# Patient Record
Sex: Male | Born: 1955 | Race: White | Hispanic: Yes | Marital: Married | Smoking: Never smoker
Health system: Southern US, Community
[De-identification: ages and names within clinical notes are randomized; demographics above are authoritative.]

## PROBLEM LIST (undated history)

## (undated) DIAGNOSIS — G43909 Migraine, unspecified, not intractable, without status migrainosus: Secondary | ICD-10-CM

## (undated) DIAGNOSIS — R42 Dizziness and giddiness: Secondary | ICD-10-CM

## (undated) HISTORY — PX: APPENDECTOMY: SHX54

## (undated) HISTORY — PX: EYE SURGERY: SHX253

---

## 1998-05-08 ENCOUNTER — Emergency Department (HOSPITAL_COMMUNITY): Admission: EM | Admit: 1998-05-08 | Discharge: 1998-05-08 | Payer: Self-pay | Admitting: Emergency Medicine

## 2009-08-14 ENCOUNTER — Ambulatory Visit: Payer: Self-pay | Admitting: Unknown Physician Specialty

## 2012-04-11 ENCOUNTER — Emergency Department (HOSPITAL_COMMUNITY)
Admission: EM | Admit: 2012-04-11 | Discharge: 2012-04-11 | Disposition: A | Discharge status: Home / Self Care | Attending: Emergency Medicine | Admitting: Emergency Medicine

## 2012-04-11 ENCOUNTER — Encounter (HOSPITAL_COMMUNITY): Payer: Self-pay | Admitting: Emergency Medicine

## 2012-04-11 DIAGNOSIS — G43909 Migraine, unspecified, not intractable, without status migrainosus: Secondary | ICD-10-CM | POA: Insufficient documentation

## 2012-04-11 HISTORY — DX: Migraine, unspecified, not intractable, without status migrainosus: G43.909

## 2012-04-11 MED ORDER — SUMATRIPTAN SUCCINATE 6 MG/0.5ML ~~LOC~~ SOLN
6.0000 mg | Freq: Once | SUBCUTANEOUS | Status: AC
  Administered 2012-04-11: 6 mg via SUBCUTANEOUS
  Filled 2012-04-11: qty 0.5

## 2012-04-11 MED ORDER — DIPHENHYDRAMINE HCL 50 MG/ML IJ SOLN
12.5000 mg | Freq: Once | INTRAMUSCULAR | Status: AC
  Administered 2012-04-11: 12.5 mg via INTRAVENOUS
  Filled 2012-04-11: qty 1

## 2012-04-11 MED ORDER — METOCLOPRAMIDE HCL 5 MG/ML IJ SOLN
10.0000 mg | Freq: Once | INTRAMUSCULAR | Status: AC
  Administered 2012-04-11: 10 mg via INTRAVENOUS
  Filled 2012-04-11: qty 2

## 2012-04-11 MED ORDER — KETOROLAC TROMETHAMINE 30 MG/ML IJ SOLN
30.0000 mg | Freq: Once | INTRAMUSCULAR | Status: AC
  Administered 2012-04-11: 30 mg via INTRAVENOUS
  Filled 2012-04-11: qty 1

## 2012-04-11 MED ORDER — SODIUM CHLORIDE 0.9 % IV BOLUS (SEPSIS)
1000.0000 mL | Freq: Once | INTRAVENOUS | Status: AC
  Administered 2012-04-11: 1000 mL via INTRAVENOUS

## 2012-04-11 NOTE — ED Notes (Signed)
Pt states he has a lot of stress at work and it has triggered a migraine headache  Pt states the pain is on the right side of his head and is feeling dizzy  Pt states pain started yesterday  Pt states has nausea without vomiting  Pt states he has taken medication at home without relief

## 2012-04-11 NOTE — ED Provider Notes (Signed)
History     CSN: [id number]  Arrival date & time 04/11/12  0459   First MD Initiated Contact with Patient 04/11/12 0600      Chief Complaint  Patient presents with  . Migraine    (Consider location/radiation/quality/duration/timing/severity/associated sxs/prior treatment) HPI  Patient presents to emergency department complaining of gradual onset migraine that began yesterday and has persisted throughout the night and into today. Patient states he has a history of chronic migraines that are intermittent however states that over the last few weeks he's had increased frequency in his migraines do to increased stress at work. Patient states that he has a primary care physician in [place] to manage his his migraines with Imitrex. Patient states he had gradual onset headache that began yesterday but did not take his Imitrex. Patient states that when the headache persisted throughout the night and into this morning he took Imitrex but states "I think I waited too long." Patient states that headache is consistent with his past history of migraines with pain on the right side of his head and is associated with nausea, photophobia, and feeling lightheaded. He denies fevers, chills, neck stiffness, difficulty speaking or ambulating. Loss of coordination, rash, chest pain, abdominal pain, extremity numbness/tingling/weakness. Patient states symptoms are gradual onset, persistent, and unchanging. Symptoms are aggravated by light in denies alleviating factors.  Past Medical History  Diagnosis Date  . Migraine     Past Surgical History  Procedure Date  . Appendectomy   . Eye surgery     Family History  Problem Relation Age of Onset  . Migraines Mother   . Hypertension Mother   . Migraines Father   . Diabetes Father     History  Substance Use Topics  . Smoking status: Never Smoker   . Smokeless tobacco: Not on file  . Alcohol Use: No      Review of Systems  All other systems  reviewed and are negative.    Allergies  Review of patient's allergies indicates no known allergies.  Home Medications   Current Outpatient Rx  Name Route Sig Dispense Refill  . VITAMIN B-6 50 MG PO TABS Oral Take 50 mg by mouth daily.    . SUMATRIPTAN SUCCINATE 50 MG PO TABS Oral Take 50 mg by mouth every 2 (two) hours as needed.     Marland Kitchen VITAMIN C 500 MG PO TABS Oral Take 1,000 mg by mouth daily.      BP 149/99  Pulse 69  Temp 98.5 F (36.9 C) (Oral)  Resp 20  SpO2 100%  Physical Exam  Nursing note and vitals reviewed. Constitutional: He is oriented to person, place, and time. He appears well-developed and well-nourished. No distress.  HENT:  Head: Normocephalic and atraumatic.  Eyes: Conjunctivae and EOM are normal. Pupils are equal, round, and reactive to light.       No nystagmus  Neck: Normal range of motion. Neck supple.       No meningeal signs  Cardiovascular: Normal rate, regular rhythm, normal heart sounds and intact distal pulses.  Exam reveals no gallop and no friction rub.   No murmur heard. Pulmonary/Chest: Effort normal and breath sounds normal. No respiratory distress. He has no wheezes. He has no rales. He exhibits no tenderness.  Abdominal: Soft. Bowel sounds are normal. He exhibits no distension and no mass. There is no tenderness. There is no rebound and no guarding.  Musculoskeletal: Normal range of motion. He exhibits no edema and no tenderness.  Neurological:  He is alert and oriented to person, place, and time. No cranial nerve deficit. Coordination normal.  Skin: Skin is warm and dry. No rash noted. He is not diaphoretic. No erythema.  Psychiatric: He has a normal mood and affect.    ED Course  Procedures (including critical care time)  IV fluids, IV reglan, toradol and benadryl.   Patient's migraine decreased from 7-8 to 1-2 after aforementioned medications.   IM imitrex  Labs Reviewed - No data to display No results found.   1. Migraine         MDM  Patient's migraine has decreased from an 8/10 pain to a 1-2/10 pain after IV migraine medications. Patient describes this current headache as his regular recurrent migraines. He has no neuro focal findings. No meningeal signs. He's afebrile and nontoxic-appearing. No concern for intracranial abnormality associated with this current headache. Patient has a regular medical physician who anxious his migraines he is agreeable to close followup for further discussion of his increasing frequency of migraines.        [place], [place] 04/11/12 [phone]

## 2012-04-12 NOTE — ED Provider Notes (Signed)
Medical screening examination/treatment/procedure(s) were performed by non-physician practitioner and as supervising physician I was immediately available for consultation/collaboration.   Hanley Seamen, MD 04/12/12 [phone]

## 2015-09-09 ENCOUNTER — Emergency Department (HOSPITAL_COMMUNITY)
Admission: EM | Admit: 2015-09-09 | Discharge: 2015-09-09 | Disposition: A | Discharge status: Home / Self Care | Attending: Emergency Medicine | Admitting: Emergency Medicine

## 2015-09-09 ENCOUNTER — Encounter (HOSPITAL_COMMUNITY): Payer: Self-pay

## 2015-09-09 ENCOUNTER — Emergency Department (HOSPITAL_COMMUNITY)

## 2015-09-09 DIAGNOSIS — J069 Acute upper respiratory infection, unspecified: Secondary | ICD-10-CM | POA: Diagnosis not present

## 2015-09-09 DIAGNOSIS — Z79899 Other long term (current) drug therapy: Secondary | ICD-10-CM | POA: Diagnosis not present

## 2015-09-09 DIAGNOSIS — R05 Cough: Secondary | ICD-10-CM | POA: Diagnosis present

## 2015-09-09 DIAGNOSIS — J208 Acute bronchitis due to other specified organisms: Secondary | ICD-10-CM

## 2015-09-09 DIAGNOSIS — G43909 Migraine, unspecified, not intractable, without status migrainosus: Secondary | ICD-10-CM | POA: Diagnosis not present

## 2015-09-09 MED ORDER — BENZONATATE 100 MG PO CAPS: 100.0000 mg | ORAL_CAPSULE | Freq: Three times a day (TID) | ORAL | Status: DC

## 2015-09-09 MED ORDER — BENZONATATE 100 MG PO CAPS
100.0000 mg | ORAL_CAPSULE | Freq: Once | ORAL | Status: AC
  Administered 2015-09-09: 100 mg via ORAL
  Filled 2015-09-09: qty 1

## 2015-09-09 NOTE — ED Notes (Signed)
Pt given rx and d/c instructions, verbalized understanding.

## 2015-09-09 NOTE — Discharge Instructions (Signed)
You have been seen today for a cough. Your imaging showed no abnormalities. Follow up with PCP in one week if you're not feeling better. Return to ED should symptoms worsen. Get plenty of rest and drink plenty of fluids. You may use tylenol or ibuprofen for pain or fever.

## 2015-09-09 NOTE — ED Provider Notes (Signed)
CSN: [id number]     Arrival date & time 09/09/15  0441 History   First MD Initiated Contact with Patient 09/09/15 0600     Chief Complaint  Patient presents with  . Cough     (Consider location/radiation/quality/duration/timing/severity/associated sxs/prior Treatment) HPI   Brian Williamson is a 59 y.o. male, patient with no pertinent past medical history, presenting to the ED with productive cough with green-yellow sputum that began three days ago. Pt denies fever, N/V/C/D, abdominal pain, chest pain, shortness of breath, or any other complaints. Pt has tried robitussin with minimal relief.     Past Medical History  Diagnosis Date  . Migraine    Past Surgical History  Procedure Laterality Date  . Appendectomy    . Eye surgery     Family History  Problem Relation Age of Onset  . Migraines Mother   . Hypertension Mother   . Migraines Father   . Diabetes Father    Social History  Substance Use Topics  . Smoking status: Never Smoker   . Smokeless tobacco: None  . Alcohol Use: No    Review of Systems  Constitutional: Negative for fever and diaphoresis.  Respiratory: Positive for cough. Negative for chest tightness, shortness of breath, wheezing and stridor.   Cardiovascular: Negative for chest pain.  Gastrointestinal: Negative for nausea, vomiting, abdominal pain, diarrhea and constipation.  Skin: Negative for pallor.  Neurological: Negative for dizziness, light-headedness and headaches.      Allergies  Review of patient's allergies indicates no known allergies.  Home Medications   Prior to Admission medications   Medication Sig Start Date End Date Taking? Authorizing Provider  b complex vitamins tablet Take 1 tablet by mouth daily.   Yes Historical Provider, MD  guaifenesin (ROBITUSSIN) 100 MG/5ML syrup Take 400 mg by mouth 3 (three) times daily as needed for cough.   Yes Historical Provider, MD  SUMAtriptan (IMITREX) 50 MG tablet Take 50 mg by mouth  every 2 (two) hours as needed.    Yes Historical Provider, MD  vitamin C (ASCORBIC ACID) 500 MG tablet Take 1,000 mg by mouth daily.   Yes Historical Provider, MD  benzonatate (TESSALON) 100 MG capsule Take 1 capsule (100 mg total) by mouth every 8 (eight) hours. 09/09/15   Sabrena Gavitt C Shallon Yaklin, PA-C   BP 132/76 mmHg  Pulse 68  Temp(Src) 98.6 F (37 C) (Oral)  Resp 16  Ht 5\' 9"  (1.753 m)  Wt 86.183 kg  BMI 28.05 kg/m2  SpO2 98% Physical Exam  Constitutional: He appears well-developed and well-nourished. No distress.  HENT:  Head: Normocephalic and atraumatic.  Right Ear: External ear normal.  Left Ear: External ear normal.  Nose: Nose normal.  Mouth/Throat: Oropharynx is clear and moist.  Eyes: Conjunctivae are normal. Pupils are equal, round, and reactive to light.  Neck: Normal range of motion. Neck supple.  Cardiovascular: Normal rate, regular rhythm and normal heart sounds.   Pulmonary/Chest: Effort normal and breath sounds normal. No tachypnea. No respiratory distress.  Abdominal: Soft. Bowel sounds are normal.  Musculoskeletal: He exhibits no edema or tenderness.  Lymphadenopathy:    He has no cervical adenopathy.  Neurological: He is alert.  Skin: Skin is warm and dry. He is not diaphoretic.  Nursing note and vitals reviewed.   ED Course  Procedures (including critical care time) Labs Review Labs Reviewed - No data to display  Imaging Review Dg Chest 2 View  09/09/2015  CLINICAL DATA:  Acute onset of sore throat, cough,  congestion and diffuse achiness. Initial encounter. EXAM: CHEST  2 VIEW COMPARISON:  None. FINDINGS: The lungs are well-aerated and clear. There is no evidence of focal opacification, pleural effusion or pneumothorax. The heart is normal in size; the mediastinal contour is within normal limits. No acute osseous abnormalities are seen. IMPRESSION: No acute cardiopulmonary process seen. Electronically Signed   By: Roanna Raider M.D.   On: 09/09/2015 05:27   I  have personally reviewed and evaluated these images and lab results as part of my medical decision-making.   EKG Interpretation None      MDM   Final diagnoses:  Acute URI  Acute viral bronchitis    Brian Williamson presents with a productive cough for 4 days.  Suspect pt has viral bronchitis with viral URI. Patient is afebrile, nontoxic appearing, and in no distress. Symptomatic treatment is indicated. Patient will be instructed to increase fluid intake, get plenty or rest, use Tessalon Perles for cough, ibuprofen or Tylenol for pain or if he develops a fever.  Filed Vitals:   09/09/15 0446 09/09/15 0659  BP: 150/90 132/76  Pulse: 76 68  Temp: 98.6 F (37 C)   TempSrc: Oral   Resp: 18 16  Height:  (1.753 m)   Weight: 86.183 kg   SpO2: 98% 98%     Concepcion Living 09/09/15 0741  April Palumbo, MD 09/09/15 [phone]

## 2015-09-09 NOTE — ED Notes (Signed)
Pt c/o cough and congestion since Sat; pt states that the cough has gotten progressively worse and yesterday he states that he began coughing up yellowish green sputum; pt c/o rib cage being sore from coughing

## 2015-09-09 NOTE — ED Notes (Signed)
Pt complains of a dry cough for one week, this morning he states he was coughing something green up, he states he hurts in his ribs and he's unable to sleep

## 2018-10-30 ENCOUNTER — Encounter (HOSPITAL_COMMUNITY): Payer: Self-pay | Admitting: *Deleted

## 2018-10-30 ENCOUNTER — Other Ambulatory Visit: Payer: Self-pay

## 2018-10-30 ENCOUNTER — Observation Stay (HOSPITAL_COMMUNITY)
Admission: EM | Admit: 2018-10-30 | Discharge: 2018-11-01 | Disposition: A | Discharge status: Home / Self Care | Attending: Internal Medicine | Admitting: Internal Medicine

## 2018-10-30 ENCOUNTER — Emergency Department (HOSPITAL_COMMUNITY)

## 2018-10-30 DIAGNOSIS — G43909 Migraine, unspecified, not intractable, without status migrainosus: Secondary | ICD-10-CM | POA: Diagnosis not present

## 2018-10-30 DIAGNOSIS — H81399 Other peripheral vertigo, unspecified ear: Secondary | ICD-10-CM | POA: Diagnosis present

## 2018-10-30 DIAGNOSIS — Z79899 Other long term (current) drug therapy: Secondary | ICD-10-CM | POA: Diagnosis not present

## 2018-10-30 DIAGNOSIS — R42 Dizziness and giddiness: Secondary | ICD-10-CM

## 2018-10-30 DIAGNOSIS — R9431 Abnormal electrocardiogram [ECG] [EKG]: Secondary | ICD-10-CM | POA: Insufficient documentation

## 2018-10-30 DIAGNOSIS — Z8249 Family history of ischemic heart disease and other diseases of the circulatory system: Secondary | ICD-10-CM | POA: Insufficient documentation

## 2018-10-30 HISTORY — DX: Dizziness and giddiness: R42

## 2018-10-30 LAB — COMPREHENSIVE METABOLIC PANEL
ALK PHOS: 50 U/L (ref 38–126)
ALT: 32 U/L (ref 0–44)
AST: 26 U/L (ref 15–41)
Albumin: 4 g/dL (ref 3.5–5.0)
Anion gap: 11 (ref 5–15)
BILIRUBIN TOTAL: 0.5 mg/dL (ref 0.3–1.2)
BUN: 24 mg/dL — AB (ref 8–23)
CALCIUM: 9.2 mg/dL (ref 8.9–10.3)
CO2: 24 mmol/L (ref 22–32)
CREATININE: 1.18 mg/dL (ref 0.61–1.24)
Chloride: 103 mmol/L (ref 98–111)
Glucose, Bld: 170 mg/dL — ABNORMAL HIGH (ref 70–99)
Potassium: 3.5 mmol/L (ref 3.5–5.1)
Sodium: 138 mmol/L (ref 135–145)
TOTAL PROTEIN: 6.9 g/dL (ref 6.5–8.1)

## 2018-10-30 LAB — CBC WITH DIFFERENTIAL/PLATELET
Abs Immature Granulocytes: 0.09 10*3/uL — ABNORMAL HIGH (ref 0.00–0.07)
BASOS PCT: 0 %
Basophils Absolute: 0 10*3/uL (ref 0.0–0.1)
Eosinophils Absolute: 0.3 10*3/uL (ref 0.0–0.5)
Eosinophils Relative: 2 %
HEMATOCRIT: 41.3 % (ref 39.0–52.0)
Hemoglobin: 13.7 g/dL (ref 13.0–17.0)
Immature Granulocytes: 1 %
LYMPHS ABS: 1.3 10*3/uL (ref 0.7–4.0)
Lymphocytes Relative: 12 %
MCH: 28.7 pg (ref 26.0–34.0)
MCHC: 33.2 g/dL (ref 30.0–36.0)
MCV: 86.4 fL (ref 80.0–100.0)
MONO ABS: 0.7 10*3/uL (ref 0.1–1.0)
MONOS PCT: 7 %
NEUTROS ABS: 8.2 10*3/uL — AB (ref 1.7–7.7)
Neutrophils Relative %: 78 %
PLATELETS: 160 10*3/uL (ref 150–400)
RBC: 4.78 MIL/uL (ref 4.22–5.81)
RDW: 13.5 % (ref 11.5–15.5)
WBC: 10.6 10*3/uL — ABNORMAL HIGH (ref 4.0–10.5)
nRBC: 0 % (ref 0.0–0.2)

## 2018-10-30 LAB — MAGNESIUM: Magnesium: 1.8 mg/dL (ref 1.7–2.4)

## 2018-10-30 MED ORDER — ONDANSETRON HCL 4 MG/2ML IJ SOLN
4.0000 mg | Freq: Once | INTRAMUSCULAR | Status: AC
  Administered 2018-10-30: 4 mg via INTRAVENOUS
  Filled 2018-10-30: qty 2

## 2018-10-30 MED ORDER — LORAZEPAM 2 MG/ML IJ SOLN
0.5000 mg | Freq: Once | INTRAMUSCULAR | Status: AC
  Administered 2018-10-30: 0.5 mg via INTRAVENOUS
  Filled 2018-10-30: qty 1

## 2018-10-30 MED ORDER — MECLIZINE HCL 25 MG PO TABS
25.0000 mg | ORAL_TABLET | Freq: Once | ORAL | Status: AC
Start: 2018-10-30 — End: 2018-10-30
  Administered 2018-10-30: 25 mg via ORAL
  Filled 2018-10-30: qty 1

## 2018-10-30 NOTE — ED Triage Notes (Addendum)
PT reports a sudden onb set of vomiting while he was his son tonight. PT also reported he started to feel dizzy around 1500 to day. Pt reported while he was helping his son with a bath he was unable to stand because he was dizzy.

## 2018-10-30 NOTE — ED Notes (Signed)
Patient transported to MRI 

## 2018-10-30 NOTE — ED Notes (Signed)
This RN attempted to ambulate the pt in his room. The pt sat on the side of the bed and continued to feel dizzy. He sat on the side of the bed for one minute. The pt sat further on the bed and continued to feel dizzy. He started dry heaving and them threw up yellow liquid. Pt unable to stand and got back in the bed. MD notified.

## 2018-10-30 NOTE — H&P (Signed)
History and Physical    Brian Williamson ZOX:[id number] DOB: July 28, 1956 DOA: 10/30/2018  Referring MD/NP/PA:   PCP: Jerl Mina, MD   Patient coming from:  The patient is coming from home.  At baseline, pt is independent for most of ADL.        Chief Complaint: Dizziness, nausea, vomiting and poor balance  HPI: Brian Williamson is a 63 y.o. male with medical history significant of migraine headache, who presents with dizziness, nausea vomiting, and poor balance.  Patient states that his symptoms started at about 4 PM, including dizziness, nausea, vomiting and poor balance.  She has vomited more than 5 times with nonbilious and nonbloody vomitus.  No diarrhea or abdominal pain.  Patient does not have ear ringing or hearing loss.  No unilateral weakness, numbness or tingling his extremities.  No vision change or hearing loss.  Patient states that his dizziness is worse with head position change.  Patient does not have chest pain, shortness of breath, cough, fever or chills.  No symptoms of UTI.  ED Course: pt was found to have WBC 10.6, creatinine 1.18, BUN 24, GFR> 60, no tachycardia, no tachypnea, oxygen saturation 89 to 95% on room air.  MRI of the brain is negative for acute intracranial abnormalities.  Patient is placed on MedSurg bed for observation.  Review of Systems:   General: no fevers, chills, no body weight gain, has poor appetite, has fatigue HEENT: no blurry vision, hearing changes or sore throat Respiratory: no dyspnea, coughing, wheezing CV: no chest pain, no palpitations GI: has nausea, vomiting, no abdominal pain, diarrhea, constipation GU: no dysuria, burning on urination, increased urinary frequency, hematuria  Ext: no leg edema Neuro: no unilateral weakness, numbness, or tingling, no vision change or hearing loss. Has poor balance and dizziness. Skin: no rash, no skin tear. MSK: No muscle spasm, no deformity, no limitation of range of movement in  spin Heme: No easy bruising.  Travel history: No recent long distant travel.  Allergy: No Known Allergies  Past Medical History:  Diagnosis Date  . Migraine     Past Surgical History:  Procedure Laterality Date  . APPENDECTOMY    . EYE SURGERY      Social History:  reports that he has never smoked. He has never used smokeless tobacco. He reports that he does not drink alcohol or use drugs.  Family History:  Family History  Problem Relation Age of Onset  . Migraines Mother   . Hypertension Mother   . Migraines Father   . Diabetes Father      Prior to Admission medications   Medication Sig Start Date End Date Taking? Authorizing Provider  b complex vitamins tablet Take 1 tablet by mouth daily.   Yes [provider]  COD LIVER OIL PO Take 1 tablet by mouth daily.   Yes [provider]  guaifenesin (ROBITUSSIN) 100 MG/5ML syrup Take 400 mg by mouth 3 (three) times daily as needed for cough.   Yes [provider]  ibuprofen (ADVIL,MOTRIN) 800 MG tablet Take 800 mg by mouth every 8 (eight) hours as needed for moderate pain.   Yes [provider]  SUMAtriptan (IMITREX) 50 MG tablet Take 50 mg by mouth every 2 (two) hours as needed for migraine.    Yes [provider]  vitamin C (ASCORBIC ACID) 500 MG tablet Take 1,000 mg by mouth daily.   Yes [provider]  benzonatate (TESSALON) 100 MG capsule Take 1 capsule (100 mg total)  by mouth every 8 (eight) hours. Patient not taking: Reported on 10/30/2018 09/09/15   Concepcion LivingJoy, Shawn C, PA-C    Physical Exam: Vitals:   10/31/18 0100 10/31/18 0115 10/31/18 0130 10/31/18 0149  BP: (!) 143/85 (!) 158/88 134/76 (!) 158/87  Pulse: 92 100 81 85  Resp:  16    Temp:  98.7 F (37.1 C)  97.9 F (36.6 C)  TempSrc:  Oral  Oral  SpO2: 93% 94% 91% 98%  Weight:    81.2 kg  Height:    5\' 10"  (1.778 m)   General: Not in acute distress HEENT:       Eyes: PERRL, EOMI, no scleral icterus.        ENT: No discharge from the ears and nose, no pharynx injection, no tonsillar enlargement.        Neck: No JVD, no bruit, no mass felt. Heme: No neck lymph node enlargement. Cardiac: S1/S2, RRR, No murmurs, No gallops or rubs. Respiratory: No rales, wheezing, rhonchi or rubs. GI: Soft, nondistended, nontender, no rebound pain, no organomegaly, BS present. GU: No hematuria Ext: No pitting leg edema bilaterally. 2+DP/PT pulse bilaterally. Musculoskeletal: No joint deformities, No joint redness or warmth, no limitation of ROM in spin. Skin: No rashes.  Neuro: Alert, oriented X3, cranial nerves II-XII grossly intact, moves all extremities normally. Psych: Patient is not psychotic, no suicidal or hemocidal ideation.  Labs on Admission: I have personally reviewed following labs and imaging studies  CBC: Recent Labs  Lab 10/30/18 1710  WBC 10.6*  NEUTROABS 8.2*  HGB 13.7  HCT 41.3  MCV 86.4  PLT 160   Basic Metabolic Panel: Recent Labs  Lab 10/30/18 1710  NA 138  K 3.5  CL 103  CO2 24  GLUCOSE 170*  BUN 24*  CREATININE 1.18  CALCIUM 9.2  MG 1.8   GFR: Estimated Creatinine Clearance: 67 mL/min (by C-G formula based on SCr of 1.18 mg/dL). Liver Function Tests: Recent Labs  Lab 10/30/18 1710  AST 26  ALT 32  ALKPHOS 50  BILITOT 0.5  PROT 6.9  ALBUMIN 4.0   No results for input(s): LIPASE, AMYLASE in the last 168 hours. No results for input(s): AMMONIA in the last 168 hours. Coagulation Profile: No results for input(s): INR, PROTIME in the last 168 hours. Cardiac Enzymes: No results for input(s): CKTOTAL, CKMB, CKMBINDEX, TROPONINI in the last 168 hours. BNP (last 3 results) No results for input(s): PROBNP in the last 8760 hours. HbA1C: No results for input(s): HGBA1C in the last 72 hours. CBG: No results for input(s): GLUCAP in the last 168 hours. Lipid Profile: No results for input(s): CHOL, HDL, LDLCALC, TRIG, CHOLHDL, LDLDIRECT in the last 72 hours. Thyroid  Function Tests: No results for input(s): TSH, T4TOTAL, FREET4, T3FREE, THYROIDAB in the last 72 hours. Anemia Panel: No results for input(s): VITAMINB12, FOLATE, FERRITIN, TIBC, IRON, RETICCTPCT in the last 72 hours. Urine analysis: No results found for: COLORURINE, APPEARANCEUR, LABSPEC, PHURINE, GLUCOSEU, HGBUR, BILIRUBINUR, KETONESUR, PROTEINUR, UROBILINOGEN, NITRITE, LEUKOCYTESUR Sepsis Labs: @LABRCNTIP (procalcitonin:4,lacticidven:4) )No results found for this or any previous visit (from the past 240 hour(s)).   Radiological Exams on Admission: Mr Brain Wo Contrast  Result Date: 10/30/2018 CLINICAL DATA:  63 y/o  M; sudden onset vomiting and dizziness. EXAM: MRI HEAD WITHOUT CONTRAST TECHNIQUE: Multiplanar, multiecho pulse sequences of the brain and surrounding structures were obtained without intravenous contrast. COMPARISON:  None. FINDINGS: Brain: No acute infarction, hemorrhage, hydrocephalus, extra-axial collection or mass lesion. Punctate nonspecific T2 FLAIR  hyperintensities in subcortical and periventricular white matter are compatible with mild chronic microvascular ischemic changes. Mild volume loss of the brain. Vascular: Normal flow voids. Skull and upper cervical spine: Normal marrow signal. Sinuses/Orbits: Mild ethmoid and left maxillary sinus mucosal thickening. No abnormal signal of mastoid air cells. Orbits are unremarkable. Other: None. IMPRESSION: 1. No acute intracranial abnormality identified. 2. Mild chronic microvascular ischemic changes and volume loss of the brain. Electronically Signed   By: Mitzi HansenLance  Furusawa-Stratton M.D.   On: 10/30/2018 20:32     EKG: Independently reviewed.  Sinus rhythm, QTC 476, LAD, early R wave progression, nonspecific T wave change.  Assessment/Plan Principal Problem:   Vertigo Active Problems:   Migraine   Vertigo: MRI of the brain is negative for acute intracranial abnormalities.Patient seems to have peripheral vertigo. -Placed on  MedSurg bed for observation -PRN meclizine -IV fluid: 1 L normal saline, then 125 cc/h - OT/vestibular PT  Migraine: -As needed sumatriptan    DVT ppx: SQ Lovenox Code Status: Full code Family Communication: Yes, patient's wife, son and grandson   at bed side Disposition Plan:  Anticipate discharge back to previous home environment Consults called:  none Admission status:    medical floor/obs      Date of Service 10/31/2018    Lorretta HarpXilin Kedar Sedano [facility]   If 7PM-7AM, please contact night-coverage [email or web address] Password [email or web address] 10/31/2018, 4:46 AM

## 2018-10-30 NOTE — ED Notes (Signed)
Admitting at bedside 

## 2018-10-30 NOTE — ED Provider Notes (Signed)
[facility] EMERGENCY DEPARTMENT Provider Note   CSN: [id number] Arrival date & time: 10/30/18  1624     History   Chief Complaint Chief Complaint  Patient presents with  . Emesis    HPI Brian Williamson is a 63 y.o. male.  HPI States he had acute onset dizziness while giving his son a bath.  Describes this dizziness as feeling off balance.  Associated with nausea and 5 episodes of vomiting.  No previously similar symptoms.  States symptoms are worse with movement.  Denies hearing changes.  No focal weakness or numbness.  No speech changes.  Patient is having some difficulty ambulating due to the dizziness.  Denies any abdominal pain or chest pain. Past Medical History:  Diagnosis Date  . Migraine     Patient Active Problem List   Diagnosis Date Noted  . Vertigo 10/30/2018  . Migraine     Past Surgical History:  Procedure Laterality Date  . APPENDECTOMY    . EYE SURGERY          Home Medications    Prior to Admission medications   Medication Sig Start Date End Date Taking? Authorizing Provider  b complex vitamins tablet Take 1 tablet by mouth daily.   Yes [provider]  COD LIVER OIL PO Take 1 tablet by mouth daily.   Yes [provider]  guaifenesin (ROBITUSSIN) 100 MG/5ML syrup Take 400 mg by mouth 3 (three) times daily as needed for cough.   Yes [provider]  ibuprofen (ADVIL,MOTRIN) 800 MG tablet Take 800 mg by mouth every 8 (eight) hours as needed for moderate pain.   Yes [provider]  SUMAtriptan (IMITREX) 50 MG tablet Take 50 mg by mouth every 2 (two) hours as needed for migraine.    Yes [provider]  vitamin C (ASCORBIC ACID) 500 MG tablet Take 1,000 mg by mouth daily.   Yes [provider]  benzonatate (TESSALON) 100 MG capsule Take 1 capsule (100 mg total) by mouth every 8 (eight) hours. Patient not taking: Reported on 10/30/2018 09/09/15   Concepcion Living     Family History Family History  Problem Relation Age of Onset  . Migraines Mother   . Hypertension Mother   . Migraines Father   . Diabetes Father     Social History Social History   Tobacco Use  . Smoking status: Never Smoker  . Smokeless tobacco: Never Used  Substance Use Topics  . Alcohol use: No  . Drug use: No     Allergies   Patient has no known allergies.   Review of Systems Review of Systems  Constitutional: Negative for chills and fever.  HENT: Negative for trouble swallowing.   Eyes: Negative for visual disturbance.  Respiratory: Negative for cough and shortness of breath.   Cardiovascular: Negative for chest pain, palpitations and leg swelling.  Gastrointestinal: Positive for nausea and vomiting. Negative for abdominal pain, constipation and diarrhea.  Musculoskeletal: Positive for gait problem. Negative for back pain, myalgias and neck pain.  Skin: Negative for rash and wound.  Neurological: Positive for dizziness. Negative for speech difficulty, weakness, light-headedness, numbness and headaches.  All other systems reviewed and are negative.    Physical Exam Updated Vital Signs BP (!) 162/88 (BP Location: Right Arm)   Pulse 87   Resp 18   Ht 5\' 10"  (1.778 m)   Wt 79.4 kg   SpO2 93%   BMI 25.11 kg/m   Physical Exam  Vitals signs and nursing note reviewed.  Constitutional:      Appearance: Normal appearance. He is well-developed.  HENT:     Head: Normocephalic and atraumatic.     Nose: Nose normal. No congestion.  Eyes:     Extraocular Movements: Extraocular movements intact.     Pupils: Pupils are equal, round, and reactive to light.     Comments: Horizontal nystagmus with fast component to the left.  Neck:     Musculoskeletal: Normal range of motion and neck supple. No neck rigidity or muscular tenderness.     Vascular: No carotid bruit.  Cardiovascular:     Rate and Rhythm: Normal rate and regular rhythm.  Pulmonary:     Effort:  Pulmonary effort is normal.     Breath sounds: Normal breath sounds.  Abdominal:     General: Bowel sounds are normal. There is no distension.     Palpations: Abdomen is soft.     Tenderness: There is no abdominal tenderness. There is no guarding or rebound.  Musculoskeletal: Normal range of motion.        General: No tenderness.  Lymphadenopathy:     Cervical: No cervical adenopathy.  Skin:    General: Skin is warm and dry.     Capillary Refill: Capillary refill takes less than 2 seconds.     Findings: No erythema or rash.  Neurological:     General: No focal deficit present.     Mental Status: He is alert and oriented to person, place, and time.     Comments: Patient is alert and oriented x3 with clear, goal oriented speech. Patient has 5/5 motor in all extremities. Sensation is intact to light touch. Bilateral finger-to-nose is normal with no signs of dysmetria.  Psychiatric:        Behavior: Behavior normal.      ED Treatments / Results  Labs (all labs ordered are listed, but only abnormal results are displayed) Labs Reviewed  CBC WITH DIFFERENTIAL/PLATELET - Abnormal; Notable for the following components:      Result Value   WBC 10.6 (*)    Neutro Abs 8.2 (*)    Abs Immature Granulocytes 0.09 (*)    All other components within normal limits  COMPREHENSIVE METABOLIC PANEL - Abnormal; Notable for the following components:   Glucose, Bld 170 (*)    BUN 24 (*)    All other components within normal limits  MAGNESIUM    EKG EKG Interpretation  Date/Time:  Tuesday October 30 2018 16:30:18 EST Ventricular Rate:  85 PR Interval:    QRS Duration: 93 QT Interval:  400 QTC Calculation: 476 R Axis:   11 Text Interpretation:  Sinus rhythm Ventricular premature complex Borderline prolonged QT interval Confirmed by Loren RacerYelverton, Kanav Kazmierczak ([id number]) on 10/30/2018 4:39:35 PM   Radiology Mr Brain Wo Contrast  Result Date: 10/30/2018 CLINICAL DATA:  63 y/o  M; sudden onset vomiting and  dizziness. EXAM: MRI HEAD WITHOUT CONTRAST TECHNIQUE: Multiplanar, multiecho pulse sequences of the brain and surrounding structures were obtained without intravenous contrast. COMPARISON:  None. FINDINGS: Brain: No acute infarction, hemorrhage, hydrocephalus, extra-axial collection or mass lesion. Punctate nonspecific T2 FLAIR hyperintensities in subcortical and periventricular white matter are compatible with mild chronic microvascular ischemic changes. Mild volume loss of the brain. Vascular: Normal flow voids. Skull and upper cervical spine: Normal marrow signal. Sinuses/Orbits: Mild ethmoid and left maxillary sinus mucosal thickening. No abnormal signal of mastoid air cells. Orbits are unremarkable. Other: None. IMPRESSION: 1. No acute  intracranial abnormality identified. 2. Mild chronic microvascular ischemic changes and volume loss of the brain. Electronically Signed   By: Mitzi HansenLance  Furusawa-Stratton M.D.   On: 10/30/2018 20:32    Procedures Procedures (including critical care time)  Medications Ordered in ED Medications  LORazepam (ATIVAN) injection 0.5 mg (0.5 mg Intravenous Given 10/30/18 1649)  ondansetron (ZOFRAN) injection 4 mg (4 mg Intravenous Given 10/30/18 1649)  meclizine (ANTIVERT) tablet 25 mg (25 mg Oral Given 10/30/18 2136)  ondansetron (ZOFRAN) injection 4 mg (4 mg Intravenous Given 10/30/18 2102)     Initial Impression / Assessment and Plan / ED Course  I have reviewed the triage vital signs and the nursing notes.  Pertinent labs & imaging results that were available during my care of the patient were reviewed by me and considered in my medical decision making (see chart for details).    MRI without acute findings.  Patient with persistent nausea, vomiting and feeling off balance despite multiple medications.  Difficulty ambulating due to symptoms.  Discussed with hospitalist who will admit for symptom control.   Final Clinical Impressions(s) / ED Diagnoses   Final diagnoses:    Peripheral vertigo, unspecified laterality    ED Discharge Orders    None       Loren RacerYelverton, Rashee Marschall, MD 10/30/18 2250

## 2018-10-30 NOTE — ED Notes (Signed)
ED Provider at bedside. 

## 2018-10-31 ENCOUNTER — Encounter (HOSPITAL_COMMUNITY): Payer: Self-pay | Admitting: Internal Medicine

## 2018-10-31 DIAGNOSIS — R42 Dizziness and giddiness: Secondary | ICD-10-CM | POA: Diagnosis not present

## 2018-10-31 LAB — GLUCOSE, CAPILLARY: Glucose-Capillary: 151 mg/dL — ABNORMAL HIGH (ref 70–99)

## 2018-10-31 MED ORDER — SUMATRIPTAN SUCCINATE 50 MG PO TABS
50.0000 mg | ORAL_TABLET | ORAL | Status: DC | PRN
  Filled 2018-10-31: qty 1

## 2018-10-31 MED ORDER — OMEGA-3-ACID ETHYL ESTERS 1 G PO CAPS
1000.0000 mg | ORAL_CAPSULE | Freq: Every day | ORAL | Status: DC
  Administered 2018-10-31 – 2018-11-01 (×2): 1000 mg via ORAL
  Filled 2018-10-31 (×2): qty 1

## 2018-10-31 MED ORDER — VITAMIN C 500 MG PO TABS
1000.0000 mg | ORAL_TABLET | Freq: Every day | ORAL | Status: DC
  Administered 2018-10-31 – 2018-11-01 (×2): 1000 mg via ORAL
  Filled 2018-10-31 (×2): qty 2

## 2018-10-31 MED ORDER — SODIUM CHLORIDE 0.9 % IV SOLN
INTRAVENOUS | Status: DC
  Administered 2018-10-31 – 2018-11-01 (×3): via INTRAVENOUS

## 2018-10-31 MED ORDER — DIAZEPAM 2 MG PO TABS
2.0000 mg | ORAL_TABLET | Freq: Two times a day (BID) | ORAL | Status: DC | PRN
  Administered 2018-10-31: 2 mg via ORAL
  Filled 2018-10-31: qty 1

## 2018-10-31 MED ORDER — SODIUM CHLORIDE 0.9 % IV SOLN
INTRAVENOUS | Status: DC
  Administered 2018-10-31 (×2): via INTRAVENOUS

## 2018-10-31 MED ORDER — ONDANSETRON HCL 4 MG PO TABS: 4.0000 mg | ORAL_TABLET | Freq: Four times a day (QID) | ORAL | Status: DC | PRN

## 2018-10-31 MED ORDER — ONDANSETRON HCL 4 MG/2ML IJ SOLN
4.0000 mg | Freq: Four times a day (QID) | INTRAMUSCULAR | Status: DC | PRN
  Administered 2018-10-31: 4 mg via INTRAVENOUS
  Filled 2018-10-31: qty 2

## 2018-10-31 MED ORDER — ENOXAPARIN SODIUM 40 MG/0.4ML ~~LOC~~ SOLN
40.0000 mg | Freq: Every day | SUBCUTANEOUS | Status: DC
  Administered 2018-10-31 – 2018-11-01 (×2): 40 mg via SUBCUTANEOUS
  Filled 2018-10-31 (×2): qty 0.4

## 2018-10-31 MED ORDER — ACETAMINOPHEN 650 MG RE SUPP: 650.0000 mg | Freq: Four times a day (QID) | RECTAL | Status: DC | PRN

## 2018-10-31 MED ORDER — SODIUM CHLORIDE 0.9 % IV BOLUS
1000.0000 mL | Freq: Once | INTRAVENOUS | Status: AC
  Administered 2018-10-31: 1000 mL via INTRAVENOUS

## 2018-10-31 MED ORDER — B COMPLEX-C PO TABS
1.0000 | ORAL_TABLET | Freq: Every day | ORAL | Status: DC
  Administered 2018-10-31 – 2018-11-01 (×2): 1 via ORAL
  Filled 2018-10-31 (×2): qty 1

## 2018-10-31 MED ORDER — SENNOSIDES-DOCUSATE SODIUM 8.6-50 MG PO TABS: 1.0000 | ORAL_TABLET | Freq: Every evening | ORAL | Status: DC | PRN

## 2018-10-31 MED ORDER — MECLIZINE HCL 25 MG PO TABS
25.0000 mg | ORAL_TABLET | Freq: Three times a day (TID) | ORAL | Status: DC | PRN
  Administered 2018-10-31 (×2): 25 mg via ORAL
  Filled 2018-10-31 (×3): qty 1

## 2018-10-31 MED ORDER — ACETAMINOPHEN 325 MG PO TABS: 650.0000 mg | ORAL_TABLET | Freq: Four times a day (QID) | ORAL | Status: DC | PRN

## 2018-10-31 MED ORDER — DIAZEPAM 2 MG PO TABS
2.0000 mg | ORAL_TABLET | Freq: Once | ORAL | Status: AC
  Administered 2018-10-31: 2 mg via ORAL
  Filled 2018-10-31 (×2): qty 1

## 2018-10-31 NOTE — Progress Notes (Signed)
[facility] PROGRESS NOTE  Brian Williamson ZDG:[id number] DOB: 12-Nov-1955 DOA: 10/30/2018 PCP: Jerl Mina, MD  Assessment/Plan: Vertigo: MRI of the brain is negative for acute intracranial abnormalities. evlauted by PT who opine right vestibular hypofunction. +nystagmus. Improved briefly then symptoms worsened with movement -will add valium -continue PT -supportive therapy -PRN meclizine -IV fluid: 1 L normal saline, then 125 cc/h   Migraine: hx of same but reports much less frequency since retirement. Resolved this am -As needed sumatriptan  Code Status: full Family Communication: none present Disposition Plan: home when ready   Consultants:  none  Procedures:    Antibiotics:    HPI/Subjective: Lying flat in bed awake. Reports headache resolved but dizziness and nausea came back quickly while with PT.   63 yo admitted vertigo in setting of migraine. migrain resolved. Vertigo persistent.   Objective: Vitals:   10/31/18 0430 10/31/18 1208  BP: (!) 152/86 135/72  Pulse: 91 71  Resp: 16 18  Temp: 97.9 F (36.6 C) 97.9 F (36.6 C)  SpO2: 94% 95%    Intake/Output Summary (Last 24 hours) at 10/31/2018 1415 Last data filed at 10/31/2018 1120 Gross per 24 hour  Intake 2959.14 ml  Output 2450 ml  Net 509.14 ml   Filed Weights   10/30/18 1629 10/31/18 0149  Weight: 79.4 kg 81.2 kg    Exam:   General:  Well nourished no acute distress  Cardiovascular: rrr no mgr no LE edema  Respiratory: normal effort BS clear bilaterally  Abdomen: non-distended +BS no guarding or reboundign  Musculoskeletal: joints without swelling/erythema  Neuro: alert oriented speech clear facial symmetry +nystagmus   Data Reviewed: Basic Metabolic Panel: Recent Labs  Lab 10/30/18 1710  NA 138  K 3.5  CL 103  CO2 24  GLUCOSE 170*  BUN 24*  CREATININE 1.18  CALCIUM 9.2  MG 1.8   Liver Function Tests: Recent Labs  Lab 10/30/18 1710  AST 26  ALT 32   ALKPHOS 50  BILITOT 0.5  PROT 6.9  ALBUMIN 4.0   No results for input(s): LIPASE, AMYLASE in the last 168 hours. No results for input(s): AMMONIA in the last 168 hours. CBC: Recent Labs  Lab 10/30/18 1710  WBC 10.6*  NEUTROABS 8.2*  HGB 13.7  HCT 41.3  MCV 86.4  PLT 160   Cardiac Enzymes: No results for input(s): CKTOTAL, CKMB, CKMBINDEX, TROPONINI in the last 168 hours. BNP (last 3 results) No results for input(s): BNP in the last 8760 hours.  ProBNP (last 3 results) No results for input(s): PROBNP in the last 8760 hours.  CBG: No results for input(s): GLUCAP in the last 168 hours.  No results found for this or any previous visit (from the past 240 hour(s)).   Studies: Mr Brain 46 Contrast  Result Date: 10/30/2018 CLINICAL DATA:  63 y/o  M; sudden onset vomiting and dizziness. EXAM: MRI HEAD WITHOUT CONTRAST TECHNIQUE: Multiplanar, multiecho pulse sequences of the brain and surrounding structures were obtained without intravenous contrast. COMPARISON:  None. FINDINGS: Brain: No acute infarction, hemorrhage, hydrocephalus, extra-axial collection or mass lesion. Punctate nonspecific T2 FLAIR hyperintensities in subcortical and periventricular white matter are compatible with mild chronic microvascular ischemic changes. Mild volume loss of the brain. Vascular: Normal flow voids. Skull and upper cervical spine: Normal marrow signal. Sinuses/Orbits: Mild ethmoid and left maxillary sinus mucosal thickening. No abnormal signal of mastoid air cells. Orbits are unremarkable. Other: None. IMPRESSION: 1. No acute intracranial abnormality identified. 2. Mild chronic microvascular ischemic changes and  volume loss of the brain. Electronically Signed   By: Mitzi Hansen M.D.   On: 10/30/2018 20:32    Scheduled Meds: . B-complex with vitamin C  1 tablet Oral Daily  . enoxaparin (LOVENOX) injection  40 mg Subcutaneous Daily  . omega-3 acid ethyl esters  1,000 mg Oral Daily  .  vitamin C  1,000 mg Oral Daily   Continuous Infusions: . sodium chloride 125 mL/hr at 10/31/18 4196    Principal Problem:   Vertigo Active Problems:   Migraine    Time spent: 45 minutes    Gwenyth Bender NP [facility]  If 7PM-7AM, please contact night-coverage at [email or web address], password [facility] 10/31/2018, 2:15 PM  LOS: 0 days

## 2018-10-31 NOTE — Evaluation (Signed)
Physical Therapy Evaluation Patient Details Name: Brian Williamson MRN: [id number] DOB: [birth year] Today's Date: 10/31/2018   History of Present Illness  Brian Williamson is a 63 y.o. male with medical history significant of migraine headache, who presents with dizziness, nausea vomiting, and poor balance.  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  He demonstrates decreased balance, decreased activity tolerance with nausea and will benefit from skilled PT in the acute setting if not d/c and follow up outpatient vestibular rehab.  If d/c home will need RW and family support especially for stairs.      Follow Up Recommendations Outpatient PT(neurorehabilitation for vestibular rehab)    Equipment Recommendations  Rolling walker with 5" wheels    Recommendations for Other Services       Precautions / Restrictions Precautions Precautions: Fall      Mobility  Bed Mobility Overal bed mobility: Needs Assistance Bed Mobility: Supine to Sit;Rolling;Sidelying to Sit Rolling: Supervision Sidelying to sit: Supervision Supine to sit: HOB elevated;Supervision     General bed mobility comments: initially up with time, rail and HOB up, second visit rolled and side to sit with rail and cues for visual compensation  Transfers Overall transfer level: Needs assistance Equipment used: Rolling walker (2 wheeled) Transfers: Sit to/from Stand Sit to Stand: Min guard         General transfer comment: increased time, cues for hand placement and visual reference for compensation  Ambulation/Gait Ambulation/Gait assistance: Min guard Gait Distance (Feet): 150 Feet Assistive device: Rolling walker (2 wheeled) Gait Pattern/deviations: Step-through pattern;Decreased stride length     General Gait Details: slower pace, reliant on UE support for balance, cues for visual reference and slower turns for  compensation  Stairs            Wheelchair Mobility    Modified Rankin (Stroke Patients Only)       Balance Overall balance assessment: Needs assistance   Sitting balance-Leahy Scale: Good     Standing balance support: Bilateral upper extremity supported Standing balance-Leahy Scale: Poor Standing balance comment: UE support for balance                             Pertinent Vitals/Pain Pain Assessment: No/denies pain    Home Living Family/patient expects to be discharged to:: Private residence Living Arrangements: Spouse/significant other;Children Available Help at Discharge: Family Type of Home: House Home Access: Stairs to enter Entrance Stairs-Rails: Right Entrance Stairs-Number of Steps: can enter to den 1 step, has split level with 4 steps with rail at front Home Layout: Multi-level Home Equipment: None      Prior Function Level of Independence: Independent         Comments: just retired from city work, was in [employer] prior to that     [employer]        Extremity/Trunk Assessment   Upper Extremity Assessment Upper Extremity Assessment: Overall WFL for tasks assessed    Lower Extremity Assessment Lower Extremity Assessment: Overall WFL for tasks assessed       Communication   Communication: No difficulties  Cognition Arousal/Alertness: Awake/alert Behavior During Therapy: WFL for tasks assessed/performed Overall Cognitive Status: Within Functional Limits for tasks assessed  General Comments General comments (skin integrity, edema, etc.): intially family in room and initiated with vestibular exam, pt became nauseated and RN in to give medication and pt returned to supine; second visit to walk and pt educated in VOR (gaze stabilization exercises) in sitting; did not perform due to not having his glasses here and reports gets migraine when trying to focus without glasses.   Issued handout for HEP   Vestibular Assessment - 10/31/18 1102      Vestibular Assessment   General Observation  Reports dizziness that started yesterday about 3pm like walking and pushing to one side.  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  Reports was helping him with a bath then felt more dizzy and fell to knees and vomiting.      Symptom Behavior   Type of Dizziness  Imbalance    Frequency of Dizziness  intermittent    Duration of Dizziness  minutes    Aggravating Factors  Turning head quickly;Activity in general    Relieving Factors  Rest;Head stationary      Occulomotor Exam   Occulomotor Alignment  Abnormal   R lateral head tilt with R eye higher in orbit   Spontaneous  Absent    Gaze-induced  Left beating nystagmus with L gaze    Smooth Pursuits  Intact    Saccades  Intact      Vestibulo-Occular Reflex   VOR 1 Head Only (x 1 viewing)  performed with horizontal head movements prior to increased nausea and RN in to give medication, pt returning to supine         Exercises     Assessment/Plan    PT Assessment Patient needs continued PT services  PT Problem List Decreased activity tolerance;Decreased balance;Decreased mobility;Decreased knowledge of use of DME;Other (comment)(dizziness)       PT Treatment Interventions DME instruction;Therapeutic exercise;Gait training;Balance training;Stair training;Functional mobility training;Patient/family education;Other (comment)(vestibular rehab)    PT Goals (Current goals can be found in the Care Plan section)  Acute Rehab PT Goals Patient Stated Goal: to get back to normal PT Goal Formulation: With patient Time For Goal Achievement: 11/07/18 Potential to Achieve Goals: Good    Frequency Min 3X/week   Barriers to discharge        Co-evaluation               AM-PAC PT "6 Clicks" Mobility  Outcome Measure Help needed turning from your back to your side while in a flat bed without using bedrails?: None Help  needed moving from lying on your back to sitting on the side of a flat bed without using bedrails?: None Help needed moving to and from a bed to a chair (including a wheelchair)?: A Little Help needed standing up from a chair using your arms (e.g., wheelchair or bedside chair)?: A Little Help needed to walk in hospital room?: A Little Help needed climbing 3-5 steps with a railing? : A Little 6 Click Score: 20    End of Session Equipment Utilized During Treatment: Gait belt Activity Tolerance: Patient limited by fatigue(& nausea)     PT Visit Diagnosis: Other abnormalities of gait and mobility (R26.89);Dizziness and giddiness (R42)    Time: 1105-1125(& [id number]) PT Time Calculation (min) (ACUTE ONLY): 20 min   Charges:   PT Evaluation $PT Eval Moderate Complexity: 1 Mod PT Treatments $Gait Training: 8-22 mins        Sheran Lawless, PT Acute Rehabilitation Services [phone] 10/31/2018   Elray Mcgregor 10/31/2018, 1:03 PM

## 2018-11-01 DIAGNOSIS — R42 Dizziness and giddiness: Secondary | ICD-10-CM | POA: Diagnosis not present

## 2018-11-01 LAB — HIV ANTIBODY (ROUTINE TESTING W REFLEX): HIV SCREEN 4TH GENERATION: NONREACTIVE

## 2018-11-01 LAB — BASIC METABOLIC PANEL
Anion gap: 9 (ref 5–15)
BUN: 12 mg/dL (ref 8–23)
CO2: 27 mmol/L (ref 22–32)
Calcium: 8.6 mg/dL — ABNORMAL LOW (ref 8.9–10.3)
Chloride: 107 mmol/L (ref 98–111)
Creatinine, Ser: 1.24 mg/dL (ref 0.61–1.24)
GFR calc Af Amer: >60 mL/min (ref >60)
GFR calc non Af Amer: >60 mL/min (ref >60)
Glucose, Bld: 111 mg/dL — ABNORMAL HIGH (ref 70–99)
Potassium: 3.6 mmol/L (ref 3.5–5.1)
Sodium: 143 mmol/L (ref 135–145)

## 2018-11-01 LAB — CBC
HCT: 40.2 % (ref 39.0–52.0)
Hemoglobin: 13.5 g/dL (ref 13.0–17.0)
MCH: 29.3 pg (ref 26.0–34.0)
MCHC: 33.6 g/dL (ref 30.0–36.0)
MCV: 87.2 fL (ref 80.0–100.0)
NRBC: 0 % (ref 0.0–0.2)
Platelets: 165 10*3/uL (ref 150–400)
RBC: 4.61 MIL/uL (ref 4.22–5.81)
RDW: 13.9 % (ref 11.5–15.5)
WBC: 6.9 10*3/uL (ref 4.0–10.5)

## 2018-11-01 LAB — HEMOGLOBIN A1C
Hgb A1c MFr Bld: 6 % — ABNORMAL HIGH (ref 4.8–5.6)
Mean Plasma Glucose: 125.5 mg/dL

## 2018-11-01 MED ORDER — MECLIZINE HCL 25 MG PO TABS: 25.0000 mg | ORAL_TABLET | Freq: Three times a day (TID) | ORAL | 0 refills | Status: DC | PRN

## 2018-11-01 MED ORDER — DIAZEPAM 2 MG PO TABS: 2.0000 mg | ORAL_TABLET | Freq: Two times a day (BID) | ORAL | 0 refills | Status: DC | PRN

## 2018-11-01 MED ORDER — ONDANSETRON HCL 4 MG PO TABS: 4.0000 mg | ORAL_TABLET | Freq: Four times a day (QID) | ORAL | 0 refills | Status: DC | PRN

## 2018-11-01 MED ORDER — PHENOL 1.4 % MT LIQD
1.0000 | OROMUCOSAL | Status: DC | PRN
  Administered 2018-11-01: 1 via OROMUCOSAL
  Filled 2018-11-01: qty 177

## 2018-11-01 MED FILL — diazePAM 2 MG TABS: 2 | 6 days supply | Qty: 12 | Fill #0

## 2018-11-01 MED FILL — MECLIZINE 12.5 MG CAPLET: 12.5 | 10 days supply | Qty: 60 | Fill #0

## 2018-11-01 MED FILL — ONDANSETRON HCL 4 MG TABLET: 4 | 5 days supply | Qty: 20 | Fill #0

## 2018-11-01 NOTE — Discharge Summary (Addendum)
Physician Discharge Summary  Brian Williamson [id number] DOB: 10-Jun-1956 DOA: 10/30/2018  PCP: Jerl MinaHedrick, James, MD  Admit date: 10/30/2018 Discharge date: 11/01/2018  Time spent: 45 minutes  Recommendations for Outpatient Follow-up:  1. Follow up with PCP 1-2 weeks for evaluation of symptoms 2. OP referral mad to [facility] for vestibular rehab   Discharge Diagnoses:  Principal Problem:   Vertigo Active Problems:   Migraine   Discharge Condition: stable  Diet recommendation: heart healthy  Filed Weights   10/30/18 1629 10/31/18 0149  Weight: 79.4 kg 81.2 kg    History of present illness:  Brian Williamson is a 63 y.o. male with medical history significant of migraine headache, who present on 10/30/18 with dizziness, nausea vomiting, and poor balance. Reported 5 episodes non-bloody emesis. No pain, no ringing of ears or hearing loss. No unilateral weakness, numbness or tingling. No vision change. No difficulty speaking or swallowing. Symptoms worse with movement of head  Hospital Course:  Vertigo:MRI of the brain is negative for acute intracranial abnormalities. evlauted by PT who opine right vestibular hypofunction. +nystagmus. Symptoms improved with antivert, valium and vestibular exercises. At discharge able to ambulate with caution and minimal assistance. Has referral to OP vestibular rehab. Will discharge with valium and antivert.  Migraine: hx of same but reports much less frequency since retirement. Resolved quickly. Continue home regimen  Procedures:  Consultations:    Discharge Exam: Vitals:   11/01/18 0601 11/01/18 0742  BP: 129/87 (!) 163/86  Pulse: (!) 56 65  Resp: 16 16  Temp: 98.3 F (36.8 C) 98.6 F (37 C)  SpO2: 94% 97%    General: sitting up in chair watching tv. No acute distress Cardiovascular: rrr no mgr no LE edema Respiratory: normal effort breath sounds clear no wheeze Neuro: alert oriented. Able to move  head slowly with little dizziness  Discharge Instructions   Discharge Instructions    Ambulatory referral to Physical Therapy   Complete by:  As directed    vestibular rehab   Diet - low sodium heart healthy   Complete by:  As directed    Discharge instructions   Complete by:  As directed    Outpatient vestibular PT No driving until symptoms resolve   Increase activity slowly   Complete by:  As directed      Allergies as of 11/01/2018   No Known Allergies     Medication List    STOP taking these medications   benzonatate 100 MG capsule Commonly known as:  TESSALON     TAKE these medications   b complex vitamins tablet Take 1 tablet by mouth daily.   COD LIVER OIL PO Take 1 tablet by mouth daily.   diazepam 2 MG tablet Commonly known as:  VALIUM Take 1 tablet (2 mg total) by mouth every 12 (twelve) hours as needed for anxiety or sedation (vertigo).   guaifenesin 100 MG/5ML syrup Commonly known as:  ROBITUSSIN Take 400 mg by mouth 3 (three) times daily as needed for cough.   ibuprofen 800 MG tablet Commonly known as:  ADVIL,MOTRIN Take 800 mg by mouth every 8 (eight) hours as needed for moderate pain.   meclizine 25 MG tablet Commonly known as:  ANTIVERT Take 1 tablet (25 mg total) by mouth 3 (three) times daily as needed for dizziness.   ondansetron 4 MG tablet Commonly known as:  ZOFRAN Take 1 tablet (4 mg total) by mouth every 6 (six) hours as needed for nausea.   SUMAtriptan  50 MG tablet Commonly known as:  IMITREX Take 50 mg by mouth every 2 (two) hours as needed for migraine.   vitamin C 500 MG tablet Commonly known as:  ASCORBIC ACID Take 1,000 mg by mouth daily.      No Known Allergies Follow-up Information    Suzanna ObeyByers, John, MD.   Specialty:  Otolaryngology Why:  As needed Contact information: [address] [place] [address] [phone]        [facility] Follow up.    Specialty:  Rehabilitation Why:  for your outpatient vestibular rehab; someone will call you for scheduling  Contact information: [address] [id number] [place] [id number] [phone]           The results of significant diagnostics from this hospitalization (including imaging, microbiology, ancillary and laboratory) are listed below for reference.    Significant Diagnostic Studies: Mr Brain 82Wo Contrast  Result Date: 10/30/2018 CLINICAL DATA:  63 y/o  M; sudden onset vomiting and dizziness. EXAM: MRI HEAD WITHOUT CONTRAST TECHNIQUE: Multiplanar, multiecho pulse sequences of the brain and surrounding structures were obtained without intravenous contrast. COMPARISON:  None. FINDINGS: Brain: No acute infarction, hemorrhage, hydrocephalus, extra-axial collection or mass lesion. Punctate nonspecific T2 FLAIR hyperintensities in subcortical and periventricular white matter are compatible with mild chronic microvascular ischemic changes. Mild volume loss of the brain. Vascular: Normal flow voids. Skull and upper cervical spine: Normal marrow signal. Sinuses/Orbits: Mild ethmoid and left maxillary sinus mucosal thickening. No abnormal signal of mastoid air cells. Orbits are unremarkable. Other: None. IMPRESSION: 1. No acute intracranial abnormality identified. 2. Mild chronic microvascular ischemic changes and volume loss of the brain. Electronically Signed   By: Mitzi HansenLance  Furusawa-Stratton M.D.   On: 10/30/2018 20:32    Microbiology: No results found for this or any previous visit (from the past 240 hour(s)).   Labs: Basic Metabolic Panel: Recent Labs  Lab 10/30/18 1710 11/01/18 0526  NA 138 143  K 3.5 3.6  CL 103 107  CO2 24 27  GLUCOSE 170* 111*  BUN 24* 12  CREATININE 1.18 1.24  CALCIUM 9.2 8.6*  MG 1.8  --    Liver Function Tests: Recent Labs  Lab 10/30/18 1710  AST 26  ALT 32  ALKPHOS 50  BILITOT 0.5  PROT 6.9  ALBUMIN 4.0   No results for  input(s): LIPASE, AMYLASE in the last 168 hours. No results for input(s): AMMONIA in the last 168 hours. CBC: Recent Labs  Lab 10/30/18 1710 11/01/18 0526  WBC 10.6* 6.9  NEUTROABS 8.2*  --   HGB 13.7 13.5  HCT 41.3 40.2  MCV 86.4 87.2  PLT 160 165   Cardiac Enzymes: No results for input(s): CKTOTAL, CKMB, CKMBINDEX, TROPONINI in the last 168 hours. BNP: BNP (last 3 results) No results for input(s): BNP in the last 8760 hours.  ProBNP (last 3 results) No results for input(s): PROBNP in the last 8760 hours.  CBG: Recent Labs  Lab 10/31/18 1748  GLUCAP 151*       Signed:  Gwenyth BenderBLACK,KAREN M NP [facility] 11/01/2018, 12:47 PM  Patient was seen, examined,treatment plan was discussed with the Advance Practice Provider.  I have personally reviewed the clinical findings, labs, EKG, imaging studies and management of this patient in detail. I have also reviewed the orders written for this patient which were under my direction. I agree with the documentation, as recorded by the Advance Practice Provider.   Brian MomErnesto Williamson  is a 63 y.o. male here with BPPV.  Responded well to therapy and medications.  Will need outpatient vestibular PT. No driving until symptoms resolve  Joseph Art, DO  How to contact the [facility] Attending or Consulting provider 7A - 7P or covering provider during after hours 7P -7A, for this patient?  1. Check the care team in [facility] and look for a) attending/consulting [facility] provider listed and b) the [facility] team listed 2. Log into [email or web address] and use [facility] universal password to access. If you do not have the password, please contact the hospital operator. 3. Locate the [facility] provider you are looking for under [facility] and page to a number that you can be directly reached. 4. If you still have difficulty reaching the provider, please page the [facility] (Director on Call) for the Hospitalists listed on amion for assistance.

## 2018-11-01 NOTE — Progress Notes (Signed)
Physical Therapy Treatment Patient Details Name: Brian Williamson MRN: [id number] DOB: 08-Feb-1956 Today's Date: 11/01/2018    History of Present Illness Brian Williamson is a 63 y.o. male with medical history significant of migraine headache, who presents with dizziness, nausea vomiting, and poor balance.    PT Comments    Patient received sitting upright in chair, very pleasant and willing to participate in PT. He reports he has been doing his vestibular exercises as given to him by the last PT, and states that his dizziness is much better today since performing the exercises. He politely declines BPPV testing multiple times due to concerns of aggravating dizziness before returning home. Able to perform sit to stand with mod(I) and gait approximately 141f with no device and S, wide BOS and good awareness/utilization of visual targets to help reduce dizziness with movement. He was left up in the chair with all needs met and questions addressed this afternoon, continued to encourage patient to follow up with vestibular therapist for ongoing progression of vestibular rehab and further recovery.    Follow Up Recommendations  Outpatient PT(neuro-rehab for vestibular PT )     Equipment Recommendations  Rolling walker with 5" wheels    Recommendations for Other Services       Precautions / Restrictions Precautions Precautions: Fall Restrictions Weight Bearing Restrictions: No    Mobility  Bed Mobility               General bed mobility comments: OOB in chair   Transfers Overall transfer level: Modified independent Equipment used: None Transfers: Sit to/from Stand Sit to Stand: Modified independent (Device/Increase time)         General transfer comment: no assistance needed but wide BOS   Ambulation/Gait Ambulation/Gait assistance: Supervision Gait Distance (Feet): 150 Feet Assistive device: None Gait Pattern/deviations: Wide base of support Gait  velocity: decreased    General Gait Details: wide BOS, gait otherwise WNL today without assistive device    Stairs             Wheelchair Mobility    Modified Rankin (Stroke Patients Only)       Balance Overall balance assessment: Needs assistance   Sitting balance-Leahy Scale: Good       Standing balance-Leahy Scale: Good                              Cognition Arousal/Alertness: Awake/alert Behavior During Therapy: WFL for tasks assessed/performed Overall Cognitive Status: Within Functional Limits for tasks assessed                                        Exercises      General Comments        Pertinent Vitals/Pain Pain Assessment: No/denies pain    Home Living                      Prior Function            PT Goals (current goals can now be found in the care plan section) Acute Rehab PT Goals Patient Stated Goal: to get back to normal PT Goal Formulation: With patient Time For Goal Achievement: 11/07/18 Potential to Achieve Goals: Good Progress towards PT goals: Progressing toward goals    Frequency    Min 3X/week      PT Plan Current plan  remains appropriate    Co-evaluation              AM-PAC PT "6 Clicks" Mobility   Outcome Measure  Help needed turning from your back to your side while in a flat bed without using bedrails?: None Help needed moving from lying on your back to sitting on the side of a flat bed without using bedrails?: None Help needed moving to and from a bed to a chair (including a wheelchair)?: None Help needed standing up from a chair using your arms (e.g., wheelchair or bedside chair)?: None Help needed to walk in hospital room?: A Little Help needed climbing 3-5 steps with a railing? : A Little 6 Click Score: 22    End of Session   Activity Tolerance: Patient tolerated treatment well Patient left: in chair;with family/visitor present;with call bell/phone within  reach   PT Visit Diagnosis: Other abnormalities of gait and mobility (R26.89);Dizziness and giddiness (R42)     Time: 1400-1410 PT Time Calculation (min) (ACUTE ONLY): 10 min  Charges:  $Gait Training: 8-22 mins                     Deniece Ree PT, DPT, CBIS  Supplemental Physical Therapist Advance    Pager [phone] Acute Rehab Office [phone]

## 2018-11-01 NOTE — Evaluation (Signed)
Occupational Therapy Evaluation Patient Details Name: Brian Williamson MRN: [id number] DOB: 1956/04/19 Today's Date: 11/01/2018    History of Present Illness Brian Williamson is a 63 y.o. male with medical history significant of migraine headache, who presents with dizziness, nausea vomiting, and poor balance.   Clinical Impression   PT admitted with dizziness. Pt currently with functional limitiations due to the deficits listed below (see OT problem list). Pt currently supervision for transfers and gaze stabilization. Pt with decr gait velocity and guarded with movement. Pt reports decr dizziness today compared to previous session.  Pt will benefit from skilled OT to increase their independence and safety with adls and balance to allow discharge home.     Follow Up Recommendations  No OT follow up    Equipment Recommendations  None recommended by OT    Recommendations for Other Services       Precautions / Restrictions Precautions Precautions: Fall Restrictions Weight Bearing Restrictions: No      Mobility Bed Mobility Overal bed mobility: Modified Independent                Transfers Overall transfer level: Needs assistance Equipment used:  Transfers: Sit to/from Stand Sit to Stand: Supervision         General transfer comment: pt guarded with transfer    Balance Overall balance assessment: Needs assistance   Sitting balance-Leahy Scale: Good     Standing balance support: Single extremity supported;During functional activity Standing balance-Leahy Scale: Fair Standing balance comment: UE support for balance                           ADL either performed or assessed with clinical judgement   ADL Overall ADL's : Needs assistance/impaired Eating/Feeding: Independent   Grooming: Wash/dry hands;Wash/dry face;Oral care;Supervision/safety;Standing Grooming Details (indicate cue type and reason): pt using 1 UE for support during  task Upper Body Bathing: Supervision/ safety   Lower Body Bathing: Supervison/ safety Lower Body Bathing Details (indicate cue type and reason): able to figure 4 cross         Toilet Transfer: Supervision/safety           Functional mobility during ADLs: Supervision/safety General ADL Comments: pt able to complete VOR exercises this morning and asking questions about sequence. pt able to return demonstrate with mod cues for stabilization of head and movement of eyes vs movement of head and not the eyes for various exercises on handout. pt reports "i want to get it right" pt reporst no nausea with task. pt with decr gait velocity without RW     Vision Baseline Vision/History: Wears glasses Wears Glasses: At all times       Perception     Praxis      Pertinent Vitals/Pain Pain Assessment: No/denies pain     Hand Dominance Right   Extremity/Trunk Assessment Upper Extremity Assessment Upper Extremity Assessment: Overall WFL for tasks assessed   Lower Extremity Assessment Lower Extremity Assessment: Defer to PT evaluation   Cervical / Trunk Assessment Cervical / Trunk Assessment: Normal   Communication Communication Communication: No difficulties   Cognition Arousal/Alertness: Awake/alert Behavior During Therapy: WFL for tasks assessed/performed Overall Cognitive Status: Within Functional Limits for tasks assessed                                     General Comments  pt demonstrates VOR exercises. pt  able to demonstrate with functional task gaze stabilization.     Exercises     Shoulder Instructions      Home Living Family/patient expects to be discharged to:: Private residence Living Arrangements: Spouse/significant other;Children Available Help at Discharge: Family Type of Home: House Home Access: Stairs to enter [employer] of Steps: can enter to den 1 step, has split level with 4 steps with rail at front Entrance  Stairs-Rails: Right Home Layout: Multi-level Alternate Level Stairs-Number of Steps: 9 to [employer], 5 up to bedrooms Alternate Level Stairs-Rails: Right Bathroom Shower/Tub: Chief Strategy Officer: Standard     Home Equipment: None   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      Prior Functioning/Environment Level of Independence: Independent        Comments: just retired from city work, was in [employer] prior to that        OT Problem List: Decreased activity tolerance;Impaired balance (sitting and/or standing);Impaired vision/perception;Decreased coordination;Decreased knowledge of use of DME or AE      OT Treatment/Interventions: Self-care/ADL training;Therapeutic exercise;DME and/or AE instruction;Therapeutic activities;Patient/family education;Balance training    OT Goals(Current goals can be found in the care plan section) Acute Rehab OT Goals Patient Stated Goal: to get back to normal OT Goal Formulation: With patient Time For Goal Achievement: 11/15/18 Potential to Achieve Goals: Good  OT Frequency: Min 2X/week   Barriers to D/C:            Co-evaluation              AM-PAC OT "6 Clicks" Daily Activity     Outcome Measure Help from another person eating meals?: None Help from another person taking care of personal grooming?: None Help from another person toileting, which includes using toliet, bedpan, or urinal?: A Little Help from another person bathing (including washing, rinsing, drying)?: A Little Help from another person to put on and taking off regular upper body clothing?: None Help from another person to put on and taking off regular lower body clothing?: A Little 6 Click Score: 21   End of Session Nurse Communication: Mobility status;Precautions  Activity Tolerance: Patient tolerated treatment well Patient left: in chair;with call bell/phone within reach  OT Visit Diagnosis: Unsteadiness on feet (R26.81);Dizziness  and giddiness (R42)                Time: 1517-6160 OT Time Calculation (min): 39 min Charges:  OT General Charges $OT Visit: 1 Visit OT Evaluation $OT Eval Moderate Complexity: 1 Mod OT Treatments $Self Care/Home Management : 8-22 mins   Mateo Flow, OTR/L  Acute Rehabilitation Services Pager: [phone] Office: [phone] .   Mateo Flow 11/01/2018, 10:31 AM

## 2018-11-01 NOTE — Progress Notes (Signed)
Occupational Therapy Treatment Patient Details Name: Enam Mcmenamin MRN: [id number] DOB: [birth year] Today's Date: 11/01/2018    History of present illness Zeyad Marra is a 63 y.o. male with medical history significant of migraine headache, who presents with dizziness, nausea vomiting, and poor balance.   OT comments  Pt making progress with functional goals, pt reports no nausea during ADL mobility and activity. Pt and his wife asked many questions about going home and when episodes of dizziness would cease. OT will continue to follow acutely  Follow Up Recommendations  No OT follow up    Equipment Recommendations  None recommended by OT    Recommendations for Other Services      Precautions / Restrictions Precautions Precautions: Fall Restrictions Weight Bearing Restrictions: No       Mobility Bed Mobility Overal bed mobility: Modified Independent             General bed mobility comments: pt in recliner upon arrival  Transfers Overall transfer level: Needs assistance Equipment used: None Transfers: Sit to/from Stand Sit to Stand: Supervision         General transfer comment: pt with guarded pace of movement    Balance Overall balance assessment: Needs assistance   Sitting balance-Leahy Scale: Good     Standing balance support: Single extremity supported;During functional activity Standing balance-Leahy Scale: Fair Standing balance comment: UE support for balance                           ADL either performed or assessed with clinical judgement   ADL Overall ADL's : Needs assistance/impaired Eating/Feeding: Independent   Grooming: Wash/dry hands;Wash/dry face;Oral care;Supervision/safety;Standing Grooming Details (indicate cue type and reason): pt using 1 UE for support during task Upper Body Bathing: Supervision/ safety   Lower Body Bathing: Supervison/ safety Lower Body Bathing Details (indicate cue type and reason):  able to figure 4 cross     Lower Body Dressing: Supervision/safety Lower Body Dressing Details (indicate cue type and reason): able to figure 4 cross Toilet Transfer: Supervision/safety;Ambulation;Regular Toilet;Grab bars   Toileting- Clothing Manipulation and Hygiene: Supervision/safety;Sit to/from stand   Tub/ Shower Transfer: Supervision/safety;Ambulation   Functional mobility during ADLs: Supervision/safety General ADL Comments: pt reports no nausea during ADL mobility and activity     Vision Baseline Vision/History: Wears glasses Wears Glasses: At all times Patient Visual Report: No change from baseline     Perception     Praxis      Cognition Arousal/Alertness: Awake/alert Behavior During Therapy: WFL for tasks assessed/performed Overall Cognitive Status: Within Functional Limits for tasks assessed                                          Exercises     Shoulder Instructions       General Comments pt demonstrates VOR exercises. pt able to demonstrate with functional task gaze stabilization.     Pertinent Vitals/ Pain       Pain Assessment: No/denies pain  Home Living Family/patient expects to be discharged to:: Private residence Living Arrangements: Spouse/significant other;Children Available Help at Discharge: Family Type of Home: House Home Access: Stairs to enter [employer] of Steps: can enter to den 1 step, has split level with 4 steps with rail at front Entrance Stairs-Rails: Right Home Layout: Multi-level Alternate Level Stairs-Number of Steps: 9 to [employer], 5 up  to bedrooms Alternate Level Stairs-Rails: Right Bathroom Shower/Tub: Chief Strategy Officer: Standard     Home Equipment: None   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      Prior Functioning/Environment Level of Independence: Independent        Comments: just retired from city work, was in [employer] prior to that    [employer] 2X/week        Progress Toward Goals  OT Goals(current goals can now be found in the care plan section)  Progress towards OT goals: Progressing toward goals  Acute Rehab OT Goals Patient Stated Goal: to get back to normal OT Goal Formulation: With patient Time For Goal Achievement: 11/15/18 Potential to Achieve Goals: Good ADL Goals Pt Will Perform Lower Body Dressing: with modified independence;sit to/from stand Pt Will Perform Tub/Shower Transfer: Tub transfer;ambulating;with modified independence  Plan Discharge plan remains appropriate    Co-evaluation                 AM-PAC OT "6 Clicks" Daily Activity     Outcome Measure   Help from another person eating meals?: None Help from another person taking care of personal grooming?: None Help from another person toileting, which includes using toliet, bedpan, or urinal?: A Little Help from another person bathing (including washing, rinsing, drying)?: A Little Help from another person to put on and taking off regular upper body clothing?: None Help from another person to put on and taking off regular lower body clothing?: A Little 6 Click Score: 21    End of Session    OT Visit Diagnosis: Unsteadiness on feet (R26.81);Dizziness and giddiness (R42)   Activity Tolerance Patient tolerated treatment well   Patient Left in chair;with call bell/phone within reach   Nurse Communication Mobility status;Precautions        Time: 1937-9024 OT Time Calculation (min): 22 min  Charges: OT General Charges $OT Visit: 1 Visit OT Evaluation $OT Eval Moderate Complexity: 1 Mod OT Treatments $Self Care/Home Management : 8-22 mins     Galen Manila 11/01/2018, 10:46 AM

## 2018-11-01 NOTE — Care Management Note (Signed)
Case Management Note  Patient Details  Name: Brian Williamson MRN: [id number] Date of Birth: [birth year]  Subjective/Objective: 63 yo male presented with dizziness, N/V and poor balance.                   Action/Plan: CM met with patient/spouse to discuss dispositional needs. Patient states living at home independently with no AD in use. PT eval was complete with vestibular rehab recommended with patient agreeable; will no longer require a RW for home use. Outpatient referral made to: [facility] for vestibular rehab referral ordered in [place]; AVS updated. No further needs from CM.   Expected Discharge Date:                  Expected Discharge Plan:  OP Rehab(vestibular rehab)  In-House Referral:  NA  Discharge planning Services  CM Consult  Post Acute Care Choice:  NA Choice offered to:  NA  DME Arranged:  N/A DME Agency:  NA  HH Arranged:  NA HH Agency:  NA  Status of Service:  Completed, signed off  If discussed at [place] of Stay Meetings, dates discussed:    Additional Comments:  Midge Minium RN, BSN, NCM-BC, ACM-RN [phone] 11/01/2018, 10:13 AM

## 2019-07-14 ENCOUNTER — Other Ambulatory Visit: Payer: Self-pay

## 2019-07-14 ENCOUNTER — Emergency Department (HOSPITAL_COMMUNITY)

## 2019-07-14 ENCOUNTER — Emergency Department (HOSPITAL_COMMUNITY)
Admission: EM | Admit: 2019-07-14 | Discharge: 2019-07-14 | Disposition: A | Discharge status: Home / Self Care | Attending: Emergency Medicine | Admitting: Emergency Medicine

## 2019-07-14 ENCOUNTER — Encounter (HOSPITAL_COMMUNITY): Payer: Self-pay | Admitting: Emergency Medicine

## 2019-07-14 DIAGNOSIS — R0789 Other chest pain: Secondary | ICD-10-CM | POA: Diagnosis not present

## 2019-07-14 DIAGNOSIS — Z79899 Other long term (current) drug therapy: Secondary | ICD-10-CM | POA: Insufficient documentation

## 2019-07-14 LAB — CBC
HCT: 44.6 % (ref 39.0–52.0)
Hemoglobin: 14.8 g/dL (ref 13.0–17.0)
MCH: 29 pg (ref 26.0–34.0)
MCHC: 33.2 g/dL (ref 30.0–36.0)
MCV: 87.5 fL (ref 80.0–100.0)
Platelets: 193 10*3/uL (ref 150–400)
RBC: 5.1 MIL/uL (ref 4.22–5.81)
RDW: 13.2 % (ref 11.5–15.5)
WBC: 6.3 10*3/uL (ref 4.0–10.5)
nRBC: 0 % (ref 0.0–0.2)

## 2019-07-14 LAB — BASIC METABOLIC PANEL
Anion gap: 9 (ref 5–15)
BUN: 20 mg/dL (ref 8–23)
CO2: 25 mmol/L (ref 22–32)
Calcium: 9.4 mg/dL (ref 8.9–10.3)
Chloride: 105 mmol/L (ref 98–111)
Creatinine, Ser: 1.06 mg/dL (ref 0.61–1.24)
GFR calc Af Amer: >60 mL/min (ref >60)
GFR calc non Af Amer: >60 mL/min (ref >60)
Glucose, Bld: 107 mg/dL — ABNORMAL HIGH (ref 70–99)
Potassium: 4.3 mmol/L (ref 3.5–5.1)
Sodium: 139 mmol/L (ref 135–145)

## 2019-07-14 LAB — TROPONIN I (HIGH SENSITIVITY): Troponin I (High Sensitivity): <2 ng/L (ref <18)

## 2019-07-14 MED ORDER — KETOROLAC TROMETHAMINE 30 MG/ML IJ SOLN
30.0000 mg | Freq: Once | INTRAMUSCULAR | Status: AC
  Administered 2019-07-14: 30 mg via INTRAVENOUS
  Filled 2019-07-14: qty 1

## 2019-07-14 MED ORDER — IBUPROFEN 600 MG PO TABS: 600.0000 mg | ORAL_TABLET | Freq: Four times a day (QID) | ORAL | 0 refills | Status: AC | PRN

## 2019-07-14 MED ORDER — CYCLOBENZAPRINE HCL 10 MG PO TABS: 10.0000 mg | ORAL_TABLET | Freq: Two times a day (BID) | ORAL | 0 refills | Status: AC | PRN

## 2019-07-14 NOTE — ED Provider Notes (Signed)
[facility]-EMERGENCY DEPT Provider Note   CSN: [id number] Arrival date & time: 07/14/19  0957     History   Chief Complaint Chief Complaint  Patient presents with  . Chest Pain    HPI Brian Williamson is a 63 y.o. male.     The history is provided by the patient. No language interpreter was used.  Chest Pain    63 year old male presenting for evaluation of chest pain.  Patient states he is a healthy individual with that exercise on a regular basis.  He normally do push-ups every day.  4 days ago after doing 130 push-ups he felt pain to the left side of his chest.  He described pain as a pulling sensation, sharp, worsening with laying on the affected side or with sudden movement.  Pain is not associate with lightheadedness, dizziness, diaphoresis, nausea or shortness of breath.  He denies any fever chills or productive cough.  Pain is improving with Motrin but has not fully resolved.  He felt like he may have pulled something.  He does not have any significant cardiac history.  He denies alcohol or tobacco use.  He denies exertional chest pain aside from that one incident.  Past Medical History:  Diagnosis Date  . Migraine   . Vertigo     Patient Active Problem List   Diagnosis Date Noted  . Vertigo 10/30/2018  . Migraine     Past Surgical History:  Procedure Laterality Date  . APPENDECTOMY    . EYE SURGERY          Home Medications    Prior to Admission medications   Medication Sig Start Date End Date Taking? Authorizing Provider  b complex vitamins tablet Take 1 tablet by mouth daily.    [provider]  COD LIVER OIL PO Take 1 tablet by mouth daily.    [provider]  diazepam (VALIUM) 2 MG tablet Take 1 tablet (2 mg total) by mouth every 12 (twelve) hours as needed for anxiety or sedation (vertigo). 11/01/18   Joseph Art, DO  guaifenesin (ROBITUSSIN) 100 MG/5ML syrup Take 400 mg by mouth 3 (three) times daily  as needed for cough.    [provider]  ibuprofen (ADVIL,MOTRIN) 800 MG tablet Take 800 mg by mouth every 8 (eight) hours as needed for moderate pain.    [provider]  meclizine (ANTIVERT) 25 MG tablet Take 1 tablet (25 mg total) by mouth 3 (three) times daily as needed for dizziness. 11/01/18   Joseph Art, DO  ondansetron (ZOFRAN) 4 MG tablet Take 1 tablet (4 mg total) by mouth every 6 (six) hours as needed for nausea. 11/01/18   Joseph Art, DO  SUMAtriptan (IMITREX) 50 MG tablet Take 50 mg by mouth every 2 (two) hours as needed for migraine.     [provider]  vitamin C (ASCORBIC ACID) 500 MG tablet Take 1,000 mg by mouth daily.    [provider]    Family History Family History  Problem Relation Age of Onset  . Migraines Mother   . Hypertension Mother   . Migraines Father   . Diabetes Father     Social History Social History   Tobacco Use  . Smoking status: Never Smoker  . Smokeless tobacco: Never Used  Substance Use Topics  . Alcohol use: No  . Drug use: No     Allergies   Patient has no known allergies.   Review of Systems  Review of Systems  Cardiovascular: Positive for chest pain.  All other systems reviewed and are negative.    Physical Exam Updated Vital Signs BP (!) 186/99 (BP Location: Right Arm)   Pulse 74   Temp 98.3 F (36.8 C) (Oral)   Resp 17   SpO2 100%   Physical Exam Vitals signs and nursing note reviewed.  Constitutional:      General: He is not in acute distress.    Appearance: He is well-developed.     Comments: Muscularly built male, resting comfortably in no acute discomfort.  Nontoxic.  HENT:     Head: Atraumatic.  Eyes:     Conjunctiva/sclera: Conjunctivae normal.  Neck:     Musculoskeletal: Neck supple.  Cardiovascular:     Heart sounds: Normal heart sounds.  Pulmonary:     Effort: Pulmonary effort is normal.     Breath sounds: Normal breath sounds.  Chest:     Chest wall: No  tenderness.     Comments: No significant tenderness to the palpation of the anterior chest wall.  No crepitus, no emphysema and no overlying skin changes. Abdominal:     Palpations: Abdomen is soft.     Tenderness: There is no abdominal tenderness.  Musculoskeletal: Normal range of motion.  Skin:    Findings: No rash.  Neurological:     Mental Status: He is alert.  Psychiatric:        Mood and Affect: Mood normal.      ED Treatments / Results  Labs (all labs ordered are listed, but only abnormal results are displayed) Labs Reviewed  BASIC METABOLIC PANEL - Abnormal; Notable for the following components:      Result Value   Glucose, Bld 107 (*)    All other components within normal limits  CBC  TROPONIN I (HIGH SENSITIVITY)  TROPONIN I (HIGH SENSITIVITY)    EKG None   Date: 07/14/2019  Rate: 76  Rhythm: normal sinus rhythm  QRS Axis: normal  Intervals: normal  ST/T Wave abnormalities: normal  Conduction Disutrbances: none  Narrative Interpretation:   Old EKG Reviewed: No significant changes noted     Radiology Dg Chest 2 View  Result Date: 07/14/2019 CLINICAL DATA:  Left chest pain, exercise injury EXAM: CHEST - 2 VIEW COMPARISON:  09/09/2015 FINDINGS: Lungs are clear.  No pleural effusion or pneumothorax. The heart is normal in size. Mild degenerative changes of the visualized thoracolumbar spine. IMPRESSION: Normal chest radiographs. Electronically Signed   By: Charline BillsSriyesh  Krishnan M.D.   On: 07/14/2019 10:40    Procedures Procedures (including critical care time)  Medications Ordered in ED Medications  ketorolac (TORADOL) 30 MG/ML injection 30 mg (has no administration in time range)     Initial Impression / Assessment and Plan / ED Course  I have reviewed the triage vital signs and the nursing notes.  Pertinent labs & imaging results that were available during my care of the patient were reviewed by me and considered in my medical decision making (see  chart for details).        BP 135/86   Pulse 63   Temp 98.3 F (36.8 C) (Oral)   Resp 16   SpO2 99%    Final Clinical Impressions(s) / ED Diagnoses   Final diagnoses:  Atypical chest pain    ED Discharge Orders         Ordered    ibuprofen (ADVIL) 600 MG tablet  Every 6 hours PRN     07/14/19  1338    cyclobenzaprine (FLEXERIL) 10 MG tablet  2 times daily PRN     07/14/19 1338         11:04 AM Patient complaining of pain to the left side of his chest after doing approximately 130 push-ups.  Pain is atypical of ACS.  Pain is minimal at this time.  Pain is likely musculoskeletal in origin. HEAR score of 2, low risk of MACE. Low Well's score, doubt PE.   1:36 PM Labs are reassuring, normal troponin, EKG and CXR normal.  Suspect MSK pain causing sxs.  Doubt ACS or PE.  D/c home with muscle relaxant and NSAIDs.  Return precaution given.  Care discussed with DR. Knapp.    Domenic Moras, PA-C 07/14/19 1339    Dorie Rank, MD 07/15/19 [phone]

## 2019-07-14 NOTE — ED Triage Notes (Signed)
Per pt, states he was exercising last Thursday, doing push ups etc-states since then having left chest pain-

## 2020-03-03 IMAGING — MR MR HEAD W/O CM
10 of 11 series · 42 of 48 positions shown · non-contrast
Comparison: None.

CLINICAL DATA: 62 y/o  M; sudden onset vomiting and dizziness.

EXAM:
MRI HEAD WITHOUT CONTRAST
TECHNIQUE: Multiplanar, multiecho pulse sequences of the brain and surrounding
structures were obtained without intravenous contrast.

[Series 5: DWI · axial · 3.0mm · 0.88mm/px · z∈[-33,+97]mm · 7 of 90 slices shown (1 of 4)]
[im 1/90]
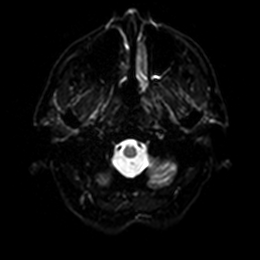
[im 15/90]
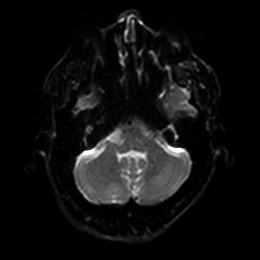
[im 30/90]
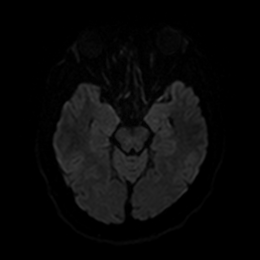
[im 45/90]
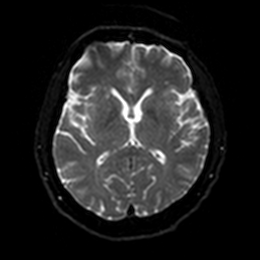
[im 60/90]
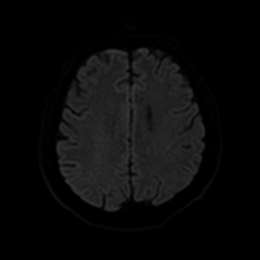
[im 75/90]
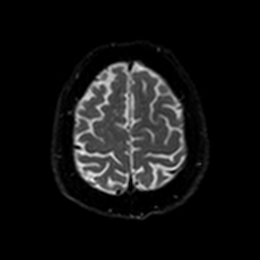
[im 90/90]
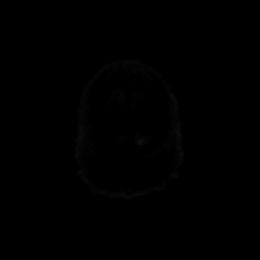

[Series 6: DWI · axial · 3.0mm · 0.88mm/px · z∈[-33,+97]mm · 3 of 44 slices shown (2 of 4)]
[im 1/44]
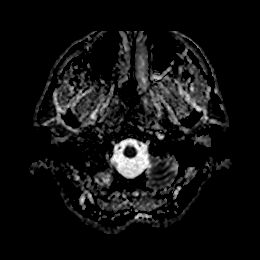
[im 22/44]
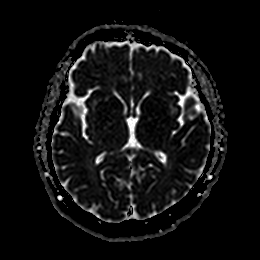
[im 44/44]
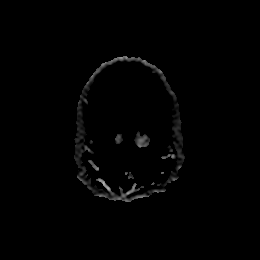

[Series 7: DWI · coronal · 4.0mm · 0.88mm/px · 6 of 64 slices shown (3 of 4)]
[im 1/64]
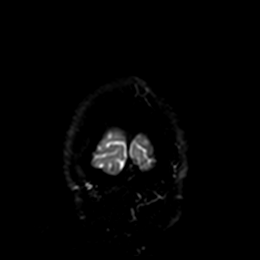
[im 13/64]
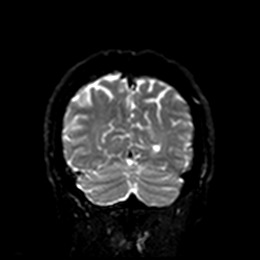
[im 26/64]
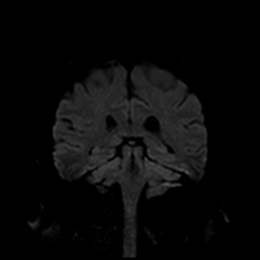
[im 38/64]
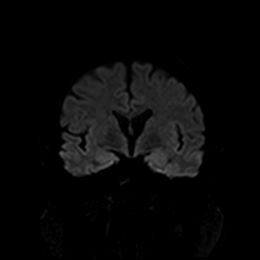
[im 51/64]
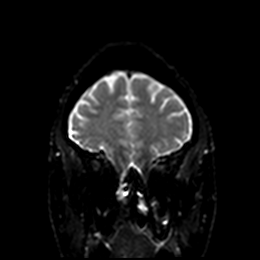
[im 64/64]
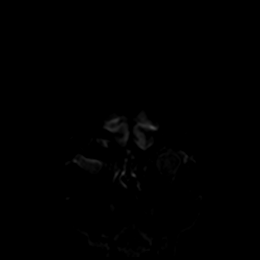

[Series 8: DWI · coronal · 4.0mm · 0.88mm/px · 3 of 32 slices shown (4 of 4)]
[im 1/32]
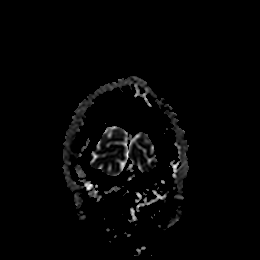
[im 16/32]
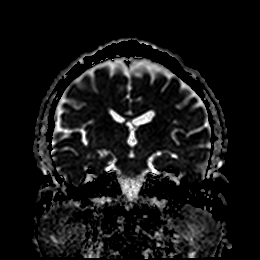
[im 32/32]
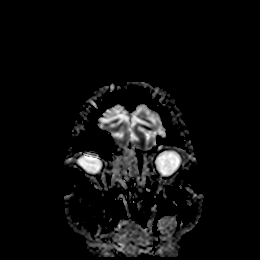

[Series 9: T1 · sagittal · 5.0mm · 0.75mm/px · 2 of 23 slices shown]
[im 1/23]
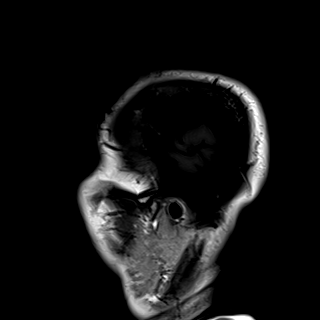
[im 23/23]
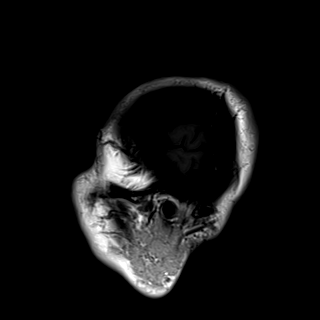

[Series 10: T2 · axial · 5.0mm · 0.72mm/px · z∈[-51,+103]mm · 3 of 27 slices shown]
[im 1/27]
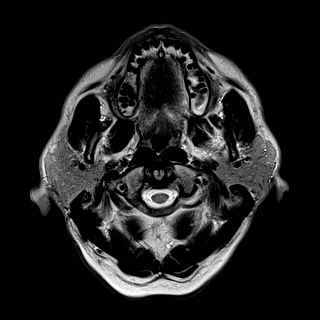
[im 14/27]
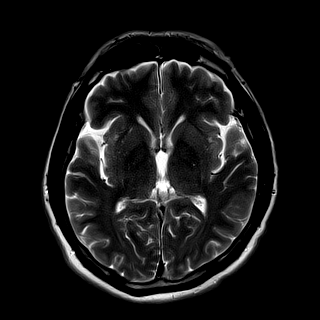
[im 27/27]
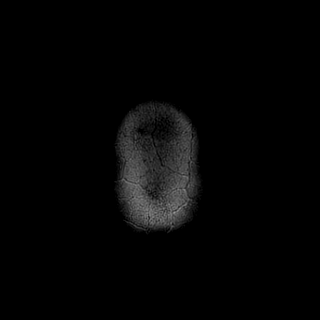

[Series 11: FLAIR · axial · 5.0mm · 0.45mm/px · z∈[-49,+106]mm · 3 of 27 slices shown]
[im 1/27]
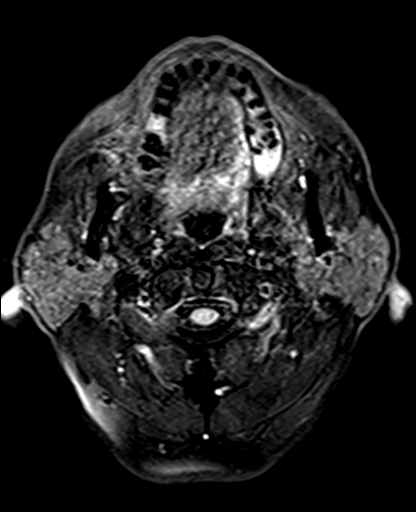
[im 14/27]
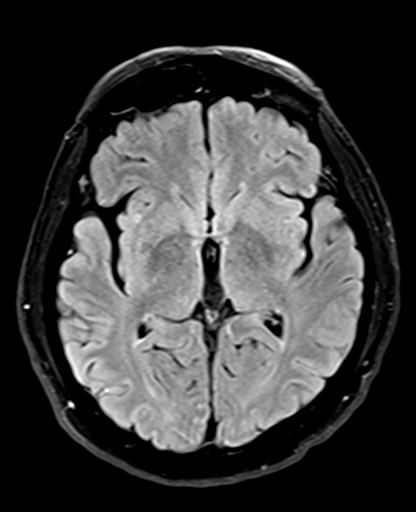
[im 27/27]
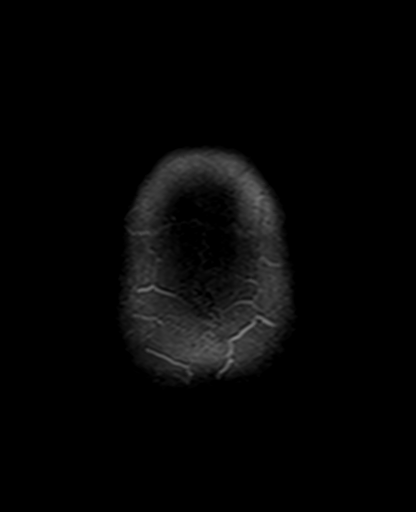

[Series 13: pha_images · axial · 3.0mm · 0.90mm/px · z∈[-53,+119]mm · 6 of 59 slices shown]
[im 1/59]
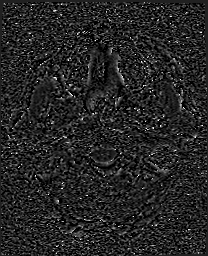
[im 12/59]
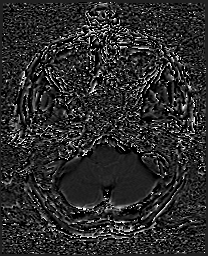
[im 24/59]
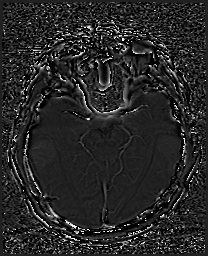
[im 35/59]
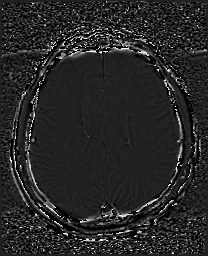
[im 47/59]
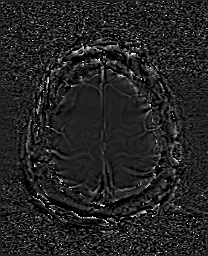
[im 59/59]
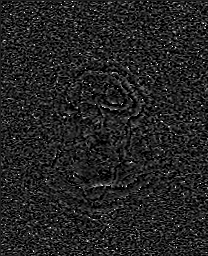

[Series 14: swi_images · axial · 3.0mm · 0.90mm/px · z∈[-53,+122]mm · 6 of 60 slices shown]
[im 1/60]
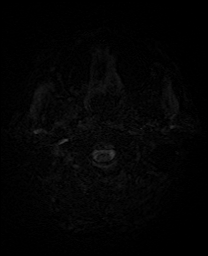
[im 12/60]
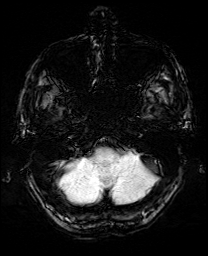
[im 24/60]
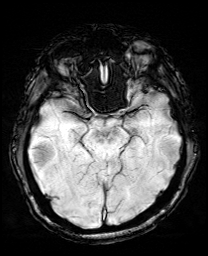
[im 36/60]
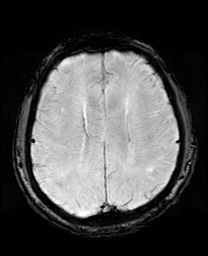
[im 48/60]
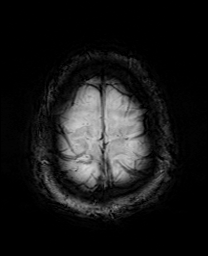
[im 60/60]
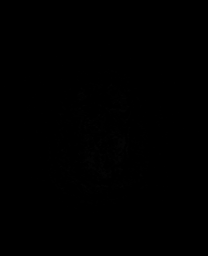

[Series 17: T2 post-contrast · coronal · 5.0mm · 0.72mm/px · 3 of 28 slices shown]
[im 1/28]
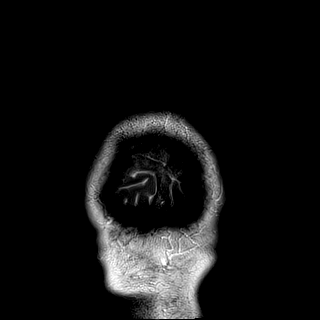
[im 14/28]
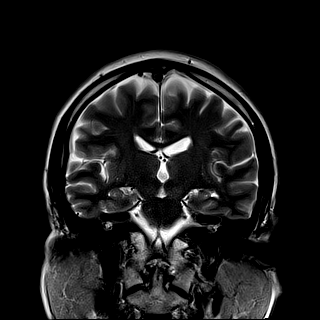
[im 28/28]
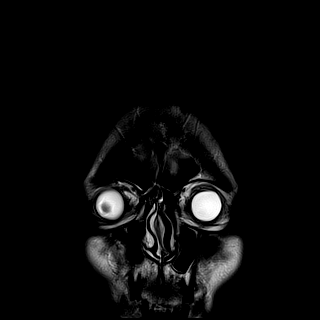

[42 of 48 positions shown; findings below may reference images not displayed]

FINDINGS: Brain: No acute infarction, hemorrhage, hydrocephalus, extra-axial
collection or mass lesion. Punctate nonspecific T2 FLAIR
hyperintensities in subcortical and periventricular white matter are
compatible with mild chronic microvascular ischemic changes. Mild
volume loss of the brain.

Vascular: Normal flow voids.

Skull and upper cervical spine: Normal marrow signal.

Sinuses/Orbits: Mild ethmoid and left maxillary sinus mucosal
thickening. No abnormal signal of mastoid air cells. Orbits are
unremarkable.

Other: None.
IMPRESSION: 1. No acute intracranial abnormality identified.
2. Mild chronic microvascular ischemic changes and volume loss of
the brain.

## 2022-11-13 ENCOUNTER — Ambulatory Visit
Admission: EM | Admit: 2022-11-13 | Discharge: 2022-11-13 | Disposition: A | Discharge status: Home / Self Care | Attending: Emergency Medicine | Admitting: Emergency Medicine

## 2022-11-13 DIAGNOSIS — R051 Acute cough: Secondary | ICD-10-CM

## 2022-11-13 MED ORDER — PREDNISONE 20 MG PO TABS: 40.0000 mg | ORAL_TABLET | Freq: Every day | ORAL | 0 refills | Status: AC

## 2022-11-13 MED ORDER — BENZONATATE 100 MG PO CAPS: 100.0000 mg | ORAL_CAPSULE | Freq: Three times a day (TID) | ORAL | 0 refills | Status: AC

## 2022-11-13 NOTE — Discharge Instructions (Signed)
On exam your lungs are clear and you are getting enough air, low suspicion for pneumonia or bronchitis at this time  Begin use of prednisone every morning with food for 5 days, this medicine helps to reduce inflammation and irritation to the airway which should help to calm your coughing  You may use Tessalon Perles every 8 hours as needed to help calm your coughing, you may also purchase over-the-counter Delsym to use in addition to this  You may use a humidifier at bedtime, if you do not have a humidifier you may steam up the bathroom and sit inside to help moisten the airway  Please ensure that you are not in a dry warm and stuffy environment as this makes it more difficult to breathe and will exacerbate your cough  Maintaining adequate hydration may help to thin secretions and soothe the respiratory mucosa   Warm Liquids- Ingestion of warm liquids may have a soothing effect on the respiratory mucosa, increase the flow of nasal mucus, and loosen respiratory secretions, making them easier to remove  May try honey (2.5 to 5 mL [0.5 to 1 teaspoon]) can be given straight or diluted in liquid (juice). Corn syrup may be substituted if honey is not available.

## 2022-11-13 NOTE — ED Provider Notes (Signed)
[name]    CSN: [id number] Arrival date & time: 11/13/22  0820      History   Chief Complaint Chief Complaint  Patient presents with   Cough    HPI Brian Williamson is a 67 y.o. male.   Patient presents for evaluation of a productive cough with yellow sputum present for 2 weeks.  Experiencing soreness of the throat only in the morning, improves throughout the day.  Tolerating food and liquids.  No known sick contacts prior.  Has attempted use of Robitussin and honey and lemon which have been ineffective.  Denies fever, chills, body aches, shortness of breath or wheezing, nasal congestion.   Past Medical History:  Diagnosis Date   Migraine    Vertigo     Patient Active Problem List   Diagnosis Date Noted   Vertigo 10/30/2018   Migraine     Past Surgical History:  Procedure Laterality Date   APPENDECTOMY     EYE SURGERY         Home Medications    Prior to Admission medications   Medication Sig Start Date End Date Taking? Authorizing Provider  b complex vitamins tablet Take 1 tablet by mouth daily.    [provider]  COD LIVER OIL PO Take 1 tablet by mouth daily.    [provider]  cyclobenzaprine (FLEXERIL) 10 MG tablet Take 1 tablet (10 mg total) by mouth 2 (two) times daily as needed for muscle spasms. 07/14/19   Domenic Moras, PA-C  glucosamine-chondroitin 500-400 MG tablet Take 1 tablet by mouth daily.    [provider]  guaifenesin (ROBITUSSIN) 100 MG/5ML syrup Take 400 mg by mouth 3 (three) times daily as needed for cough.    [provider]  ibuprofen (ADVIL) 600 MG tablet Take 1 tablet (600 mg total) by mouth every 6 (six) hours as needed. 07/14/19   Domenic Moras, PA-C  SUMAtriptan (IMITREX) 50 MG tablet Take 50 mg by mouth every 2 (two) hours as needed for migraine.     [provider]  vitamin C (ASCORBIC ACID) 500 MG tablet Take 1,000 mg by mouth daily.    [provider]     Family History Family History  Problem Relation Age of Onset   Migraines Mother    Hypertension Mother    Migraines Father    Diabetes Father     Social History Social History   Tobacco Use   Smoking status: Never   Smokeless tobacco: Never  Substance Use Topics   Alcohol use: No   Drug use: No     Allergies   Patient has no known allergies.   Review of Systems Review of Systems  Constitutional: Negative.   HENT: Negative.    Respiratory:  Positive for cough. Negative for apnea, choking, chest tightness, shortness of breath, wheezing and stridor.   Cardiovascular: Negative.   Gastrointestinal: Negative.   Skin: Negative.      Physical Exam Triage Vital Signs ED Triage Vitals [11/13/22 0915]  Enc Vitals Group     BP (!) 184/79     Pulse Rate 82     Resp 17     Temp 98 F (36.7 C)     Temp Source Oral     SpO2 98 %     Weight      Height      Head Circumference      Peak Flow      Pain Score 0  Pain Loc      Pain Edu?      Excl. in [place]?    No data found.  Updated Vital Signs BP (!) 184/79 (BP Location: Left Arm)   Pulse 82   Temp 98 F (36.7 C) (Oral)   Resp 17   SpO2 98%   Visual Acuity Right Eye Distance:   Left Eye Distance:   Bilateral Distance:    Right Eye Near:   Left Eye Near:    Bilateral Near:     Physical Exam Constitutional:      Appearance: Normal appearance.  HENT:     Right Ear: Tympanic membrane, ear canal and external ear normal.     Left Ear: Tympanic membrane, ear canal and external ear normal.     Nose: Nose normal.     Mouth/Throat:     Mouth: Mucous membranes are moist.     Pharynx: Posterior oropharyngeal erythema present.  Cardiovascular:     Rate and Rhythm: Normal rate and regular rhythm.     Pulses: Normal pulses.     Heart sounds: Normal heart sounds.  Pulmonary:     Effort: Pulmonary effort is normal.     Breath sounds: Normal breath sounds.  Skin:    General: Skin is warm and dry.   Neurological:     Mental Status: He is alert and oriented to person, place, and time. Mental status is at baseline.      UC Treatments / Results  Labs (all labs ordered are listed, but only abnormal results are displayed) Labs Reviewed - No data to display  EKG   Radiology No results found.  Procedures Procedures (including critical care time)  Medications Ordered in UC Medications - No data to display  Initial Impression / Assessment and Plan / UC Course  I have reviewed the triage vital signs and the nursing notes.  Pertinent labs & imaging results that were available during my care of the patient were reviewed by me and considered in my medical decision making (see chart for details).  Acute cough  Patient is in no signs of distress nor toxic appearing.  Vital signs are stable.  O2 saturation 98% on room air lungs are clear to auscultation, low suspicion for pneumonia, pneumothorax or bronchitis and therefore will defer imaging.  Viral testing deferred due to timeline of illness.  Prescribed prednisone and Tessalon as he is not successful with this medication in the past. May use additional over-the-counter medications as needed for supportive care.  May follow-up with urgent care as needed if symptoms persist or worsen.   Final Clinical Impressions(s) / UC Diagnoses   Final diagnoses:  None   Discharge Instructions   None    ED Prescriptions   None    PDMP not reviewed this encounter.   Hans Eden, [place] 11/13/22 [phone]

## 2022-11-13 NOTE — ED Triage Notes (Signed)
Pt presents with productive cough X 2 weeks.

## 2022-11-14 ENCOUNTER — Ambulatory Visit: Payer: Self-pay
